# Patient Record
Sex: Male | Born: 1965 | Race: White | Hispanic: No | Marital: Single | Smoking: Never smoker
Health system: Southern US, Community
[De-identification: ages and names within clinical notes are randomized; demographics above are authoritative.]

## PROBLEM LIST (undated history)

## (undated) DIAGNOSIS — M549 Dorsalgia, unspecified: Secondary | ICD-10-CM

## (undated) DIAGNOSIS — I10 Essential (primary) hypertension: Secondary | ICD-10-CM

## (undated) HISTORY — PX: KNEE SURGERY: SHX244

## (undated) HISTORY — PX: LAMINECTOMY: SHX219

## (undated) HISTORY — PX: CARPAL TUNNEL RELEASE: SHX101

---

## 2007-01-31 ENCOUNTER — Emergency Department (HOSPITAL_COMMUNITY): Admission: EM | Admit: 2007-01-31 | Discharge: 2007-01-31 | Payer: Self-pay | Admitting: Emergency Medicine

## 2008-08-07 IMAGING — CR CERV MYELO
1 series · 1 of 1 positions shown · non-contrast
Comparison: none

[view not recorded]
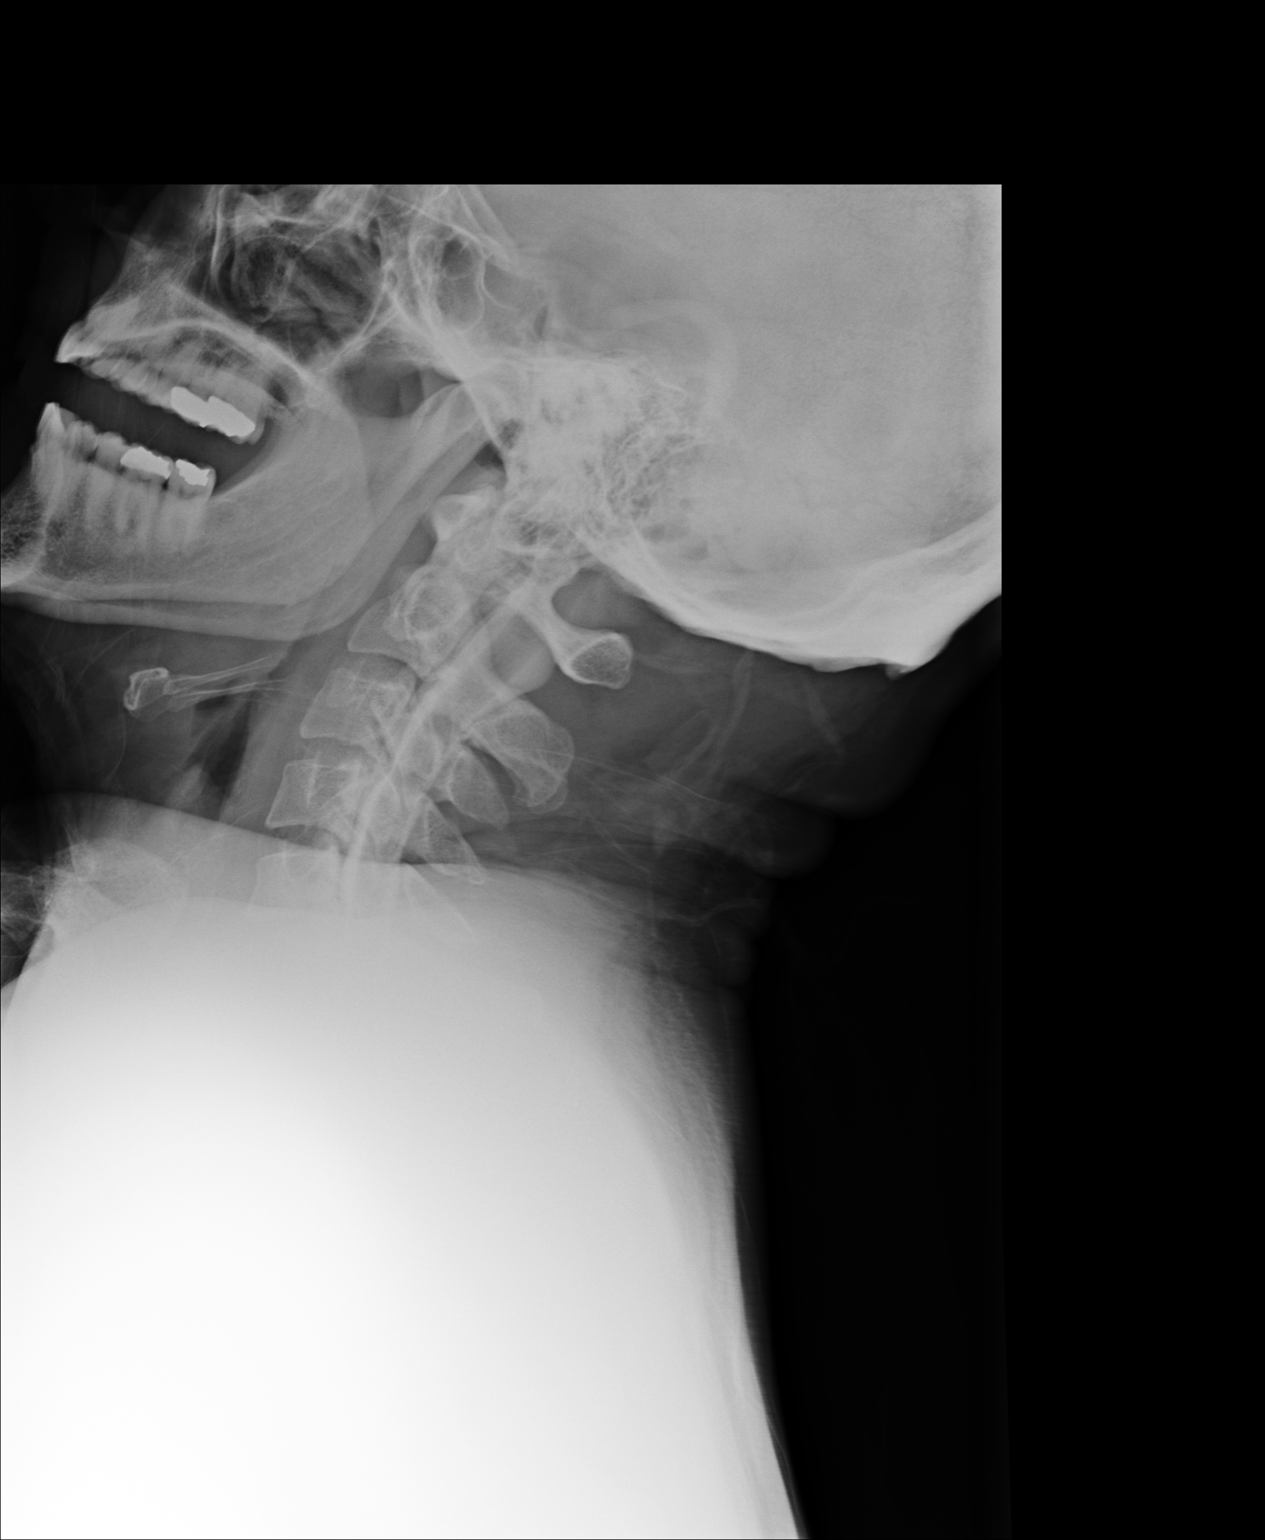

[1 of 1 positions shown; findings below may reference images not displayed]

______________________________________________________________
AW, ZIQIANG 12/03/1925 DALDUS, ALFA AMADU DJUE [DATE]

REASON FOR CONSULTATION: ROUTINE [id number]
p>____________________________________________

EXAM: SCREEN BREAST EXAM

The original x-rays and a report indicating their findings will be
stored for a period of 60 calender months as
a part of the patient's medical record.

SCREENING MAMMOGRAM

INTERPRETATION:

1. ( X ) Benign.
2. ( ) Probably benign but with areas of thickening, calcification, or
nodules
 for which follow up is suggested.
3. ( ) Indeterminant. Recommend additional studies such as ultrasound,
more views,
 follow up, aspiration, or biopsy.
4. ( ) Suspicious for carcinoma.

PARENCHYMAL PATTERN
SORIN OXENDINE [DATE]
 VERIFIED<p

______________________________________________________________

1 ( ) Fatty.
2. (  X ) Moderately dense.
3. ( ) Dense

( Dense breasts may obscure a noncalcified lesion. Sensitivity of
mammography varies inversely with density)

ADDITIONAL COMMENTS
No prior films available for comparison. The films are dated 12/15/95.
A few scattered microcalcifications are seen in both breasts. Continued
yearly follow-up is recommended.

SORIN OXENDINE [DATE]

## 2011-01-25 ENCOUNTER — Emergency Department (HOSPITAL_COMMUNITY): Payer: Self-pay

## 2011-01-25 ENCOUNTER — Emergency Department (HOSPITAL_COMMUNITY)
Admission: EM | Admit: 2011-01-25 | Discharge: 2011-01-25 | Disposition: A | Discharge status: Home / Self Care | Payer: Self-pay | Attending: Emergency Medicine | Admitting: Emergency Medicine

## 2011-01-25 DIAGNOSIS — IMO0002 Reserved for concepts with insufficient information to code with codable children: Secondary | ICD-10-CM | POA: Insufficient documentation

## 2011-01-25 DIAGNOSIS — M545 Low back pain, unspecified: Secondary | ICD-10-CM | POA: Insufficient documentation

## 2011-01-25 DIAGNOSIS — M79609 Pain in unspecified limb: Secondary | ICD-10-CM | POA: Insufficient documentation

## 2011-01-25 DIAGNOSIS — M5137 Other intervertebral disc degeneration, lumbosacral region: Secondary | ICD-10-CM | POA: Insufficient documentation

## 2011-01-25 DIAGNOSIS — M51379 Other intervertebral disc degeneration, lumbosacral region without mention of lumbar back pain or lower extremity pain: Secondary | ICD-10-CM | POA: Insufficient documentation

## 2011-01-25 LAB — URINALYSIS, ROUTINE W REFLEX MICROSCOPIC
Bilirubin Urine: NEGATIVE
Hgb urine dipstick: NEGATIVE
Ketones, ur: NEGATIVE mg/dL
Protein, ur: NEGATIVE mg/dL
Urobilinogen, UA: 0.2 mg/dL (ref 0.0–1.0)

## 2016-04-27 DIAGNOSIS — M545 Low back pain, unspecified: Secondary | ICD-10-CM | POA: Insufficient documentation

## 2016-04-27 DIAGNOSIS — G4701 Insomnia due to medical condition: Secondary | ICD-10-CM | POA: Insufficient documentation

## 2016-05-29 DIAGNOSIS — M5136 Other intervertebral disc degeneration, lumbar region: Secondary | ICD-10-CM | POA: Insufficient documentation

## 2016-05-29 DIAGNOSIS — M51369 Other intervertebral disc degeneration, lumbar region without mention of lumbar back pain or lower extremity pain: Secondary | ICD-10-CM | POA: Insufficient documentation

## 2016-07-06 DIAGNOSIS — S46219A Strain of muscle, fascia and tendon of other parts of biceps, unspecified arm, initial encounter: Secondary | ICD-10-CM | POA: Insufficient documentation

## 2016-07-23 DIAGNOSIS — M48062 Spinal stenosis, lumbar region with neurogenic claudication: Secondary | ICD-10-CM | POA: Insufficient documentation

## 2020-03-28 ENCOUNTER — Other Ambulatory Visit: Payer: Self-pay

## 2020-03-28 ENCOUNTER — Encounter (HOSPITAL_COMMUNITY): Payer: Self-pay | Admitting: Emergency Medicine

## 2020-03-28 ENCOUNTER — Emergency Department (INDEPENDENT_AMBULATORY_CARE_PROVIDER_SITE_OTHER)
Admission: EM | Admit: 2020-03-28 | Discharge: 2020-03-28 | Disposition: A | Discharge status: Home / Self Care | Payer: Self-pay | Source: Home / Self Care

## 2020-03-28 ENCOUNTER — Emergency Department (HOSPITAL_COMMUNITY)
Admission: EM | Admit: 2020-03-28 | Discharge: 2020-03-28 | Disposition: A | Discharge status: Home / Self Care | Payer: Self-pay | Attending: Emergency Medicine | Admitting: Emergency Medicine

## 2020-03-28 ENCOUNTER — Emergency Department (INDEPENDENT_AMBULATORY_CARE_PROVIDER_SITE_OTHER): Payer: Self-pay

## 2020-03-28 DIAGNOSIS — Z87828 Personal history of other (healed) physical injury and trauma: Secondary | ICD-10-CM

## 2020-03-28 DIAGNOSIS — M79644 Pain in right finger(s): Secondary | ICD-10-CM

## 2020-03-28 DIAGNOSIS — Z79899 Other long term (current) drug therapy: Secondary | ICD-10-CM | POA: Insufficient documentation

## 2020-03-28 DIAGNOSIS — Z20822 Contact with and (suspected) exposure to covid-19: Secondary | ICD-10-CM | POA: Insufficient documentation

## 2020-03-28 DIAGNOSIS — M869 Osteomyelitis, unspecified: Secondary | ICD-10-CM | POA: Insufficient documentation

## 2020-03-28 DIAGNOSIS — I10 Essential (primary) hypertension: Secondary | ICD-10-CM | POA: Insufficient documentation

## 2020-03-28 HISTORY — DX: Dorsalgia, unspecified: M54.9

## 2020-03-28 HISTORY — DX: Essential (primary) hypertension: I10

## 2020-03-28 LAB — RESPIRATORY PANEL BY RT PCR (FLU A&B, COVID)
Influenza A by PCR: NEGATIVE
Influenza B by PCR: NEGATIVE
SARS Coronavirus 2 by RT PCR: NEGATIVE

## 2020-03-28 LAB — POCT CBC W AUTO DIFF (K'VILLE URGENT CARE)

## 2020-03-28 MED ORDER — CLINDAMYCIN HCL 150 MG PO CAPS: 300.0000 mg | ORAL_CAPSULE | Freq: Four times a day (QID) | ORAL | 0 refills | Status: DC

## 2020-03-28 MED ORDER — CLINDAMYCIN HCL 150 MG PO CAPS
600.0000 mg | ORAL_CAPSULE | Freq: Once | ORAL | Status: AC
  Administered 2020-03-28: 600 mg via ORAL
  Filled 2020-03-28: qty 4

## 2020-03-28 MED ORDER — OXYCODONE-ACETAMINOPHEN 5-325 MG PO TABS: 2.0000 | ORAL_TABLET | Freq: Four times a day (QID) | ORAL | 0 refills | Status: DC | PRN

## 2020-03-28 NOTE — ED Notes (Signed)
PA assessing pt on triage.
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
## 2020-03-28 NOTE — Discharge Instructions (Addendum)
Please take antibiotics as prescribed.  I have provided you with the phone number and office information for Dr. Roney Mans of [facility].  Please follow-up first thing tomorrow in the office.  Please go to the office as soon as it opens. It is very important that you do this as you can become very very sick if you do not take the antibiotics and have the finger treated.  There is a possibility that you will likely need to have your fingertip surgically removed.  I am prescribing you 1 week of antibiotics but you may need a longer prescription depending on what your hand surgeon recommends.
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
## 2020-03-28 NOTE — Discharge Instructions (Signed)
Due to the severity of your symptoms, it is advised that you go to the emergency department tonight for further evaluation and treatment of your severe infection to help limit the risk of worsening infection. The longer the infection goes untreated, the more risk you have of losing more of your finger(s).
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
## 2020-03-28 NOTE — ED Triage Notes (Signed)
Pt send from UC for evaluation of hand surgeon of his right index for diagnose of osteomyelitis.

## 2020-03-29 DIAGNOSIS — M869 Osteomyelitis, unspecified: Secondary | ICD-10-CM | POA: Insufficient documentation

## 2020-04-01 ENCOUNTER — Other Ambulatory Visit: Payer: Self-pay

## 2020-04-01 ENCOUNTER — Ambulatory Visit (HOSPITAL_COMMUNITY): Payer: Self-pay | Admitting: Certified Registered"

## 2020-04-01 ENCOUNTER — Encounter (HOSPITAL_COMMUNITY)
Admission: RE | Disposition: A | Discharge status: Home / Self Care | Payer: Self-pay | Source: Home / Self Care | Attending: Orthopaedic Surgery

## 2020-04-01 ENCOUNTER — Encounter (HOSPITAL_COMMUNITY): Payer: Self-pay | Admitting: Orthopaedic Surgery

## 2020-04-01 ENCOUNTER — Ambulatory Visit (HOSPITAL_COMMUNITY)
Admission: RE | Admit: 2020-04-01 | Discharge: 2020-04-01 | Disposition: A | Discharge status: Home / Self Care | Payer: Self-pay | Attending: Orthopaedic Surgery | Admitting: Orthopaedic Surgery

## 2020-04-01 DIAGNOSIS — I10 Essential (primary) hypertension: Secondary | ICD-10-CM | POA: Insufficient documentation

## 2020-04-01 DIAGNOSIS — M869 Osteomyelitis, unspecified: Secondary | ICD-10-CM | POA: Insufficient documentation

## 2020-04-01 DIAGNOSIS — X58XXXA Exposure to other specified factors, initial encounter: Secondary | ICD-10-CM | POA: Insufficient documentation

## 2020-04-01 DIAGNOSIS — Z885 Allergy status to narcotic agent status: Secondary | ICD-10-CM | POA: Insufficient documentation

## 2020-04-01 DIAGNOSIS — Z79899 Other long term (current) drug therapy: Secondary | ICD-10-CM | POA: Insufficient documentation

## 2020-04-01 DIAGNOSIS — S60942A Unspecified superficial injury of right middle finger, initial encounter: Secondary | ICD-10-CM | POA: Insufficient documentation

## 2020-04-01 HISTORY — PX: AMPUTATION: SHX166

## 2020-04-01 LAB — BASIC METABOLIC PANEL
Anion gap: 10 (ref 5–15)
BUN: 9 mg/dL (ref 6–20)
CO2: 23 mmol/L (ref 22–32)
Calcium: 10.4 mg/dL — ABNORMAL HIGH (ref 8.9–10.3)
Chloride: 103 mmol/L (ref 98–111)
Creatinine, Ser: 0.82 mg/dL (ref 0.61–1.24)
GFR calc Af Amer: >60 mL/min (ref >60)
GFR calc non Af Amer: >60 mL/min (ref >60)
Glucose, Bld: 98 mg/dL (ref 70–99)
Potassium: 3.9 mmol/L (ref 3.5–5.1)
Sodium: 136 mmol/L (ref 135–145)

## 2020-04-01 LAB — SURGICAL PCR SCREEN
MRSA, PCR: NEGATIVE
Staphylococcus aureus: POSITIVE — AB

## 2020-04-01 SURGERY — AMPUTATION DIGIT
Anesthesia: Monitor Anesthesia Care | Site: Finger | Laterality: Right

## 2020-04-01 MED ORDER — PROPOFOL 500 MG/50ML IV EMUL
INTRAVENOUS | Status: DC | PRN
  Administered 2020-04-01: 100 ug/kg/min via INTRAVENOUS

## 2020-04-01 MED ORDER — ORAL CARE MOUTH RINSE: 15.0000 mL | Freq: Once | OROMUCOSAL | Status: AC

## 2020-04-01 MED ORDER — ONDANSETRON HCL 4 MG/2ML IJ SOLN
INTRAMUSCULAR | Status: DC | PRN
  Administered 2020-04-01: 4 mg via INTRAVENOUS

## 2020-04-01 MED ORDER — MIDAZOLAM HCL 2 MG/2ML IJ SOLN
INTRAMUSCULAR | Status: AC
  Filled 2020-04-01: qty 2

## 2020-04-01 MED ORDER — LIDOCAINE HCL 1 % IJ SOLN
INTRAMUSCULAR | Status: DC | PRN
  Administered 2020-04-01: 5 mL

## 2020-04-01 MED ORDER — CLINDAMYCIN HCL 150 MG PO CAPS: 300.0000 mg | ORAL_CAPSULE | Freq: Four times a day (QID) | ORAL | 0 refills | Status: AC

## 2020-04-01 MED ORDER — LACTATED RINGERS IV SOLN: INTRAVENOUS | Status: DC

## 2020-04-01 MED ORDER — BUPIVACAINE HCL (PF) 0.25 % IJ SOLN
INTRAMUSCULAR | Status: AC
  Filled 2020-04-01: qty 30

## 2020-04-01 MED ORDER — CHLORHEXIDINE GLUCONATE CLOTH 2 % EX PADS: 6.0000 | MEDICATED_PAD | Freq: Every day | CUTANEOUS | Status: DC

## 2020-04-01 MED ORDER — FENTANYL CITRATE (PF) 100 MCG/2ML IJ SOLN
INTRAMUSCULAR | Status: AC
  Administered 2020-04-01: 25 ug via INTRAVENOUS
  Filled 2020-04-01: qty 2

## 2020-04-01 MED ORDER — CHLORHEXIDINE GLUCONATE 4 % EX LIQD: 60.0000 mL | Freq: Once | CUTANEOUS | Status: DC

## 2020-04-01 MED ORDER — FENTANYL CITRATE (PF) 250 MCG/5ML IJ SOLN
INTRAMUSCULAR | Status: AC
  Filled 2020-04-01: qty 5

## 2020-04-01 MED ORDER — MUPIROCIN 2 % EX OINT: 1.0000 "application " | TOPICAL_OINTMENT | Freq: Two times a day (BID) | CUTANEOUS | Status: DC

## 2020-04-01 MED ORDER — CHLORHEXIDINE GLUCONATE 0.12 % MT SOLN: 15.0000 mL | Freq: Once | OROMUCOSAL | Status: AC

## 2020-04-01 MED ORDER — OXYCODONE-ACETAMINOPHEN 5-325 MG PO TABS: 1.0000 | ORAL_TABLET | Freq: Four times a day (QID) | ORAL | 0 refills | Status: DC | PRN

## 2020-04-01 MED ORDER — BUPIVACAINE HCL (PF) 0.25 % IJ SOLN
INTRAMUSCULAR | Status: DC | PRN
  Administered 2020-04-01: 5 mL

## 2020-04-01 MED ORDER — 0.9 % SODIUM CHLORIDE (POUR BTL) OPTIME
TOPICAL | Status: DC | PRN
  Administered 2020-04-01: 1000 mL

## 2020-04-01 MED ORDER — PROPOFOL 10 MG/ML IV BOLUS
INTRAVENOUS | Status: DC | PRN
  Administered 2020-04-01 (×2): 50 mg via INTRAVENOUS

## 2020-04-01 MED ORDER — POVIDONE-IODINE 10 % EX SWAB
2.0000 "application " | Freq: Once | CUTANEOUS | Status: AC
  Administered 2020-04-01: 2 via TOPICAL

## 2020-04-01 MED ORDER — CHLORHEXIDINE GLUCONATE 0.12 % MT SOLN
OROMUCOSAL | Status: AC
  Administered 2020-04-01: 15 mL via OROMUCOSAL
  Filled 2020-04-01: qty 15

## 2020-04-01 MED ORDER — PROPOFOL 10 MG/ML IV BOLUS
INTRAVENOUS | Status: AC
  Filled 2020-04-01: qty 20

## 2020-04-01 MED ORDER — FENTANYL CITRATE (PF) 100 MCG/2ML IJ SOLN: 50.0000 ug | Freq: Once | INTRAMUSCULAR | Status: AC

## 2020-04-01 MED ORDER — LIDOCAINE HCL (PF) 1 % IJ SOLN
INTRAMUSCULAR | Status: AC
  Filled 2020-04-01: qty 30

## 2020-04-01 MED ORDER — MIDAZOLAM HCL 5 MG/5ML IJ SOLN
INTRAMUSCULAR | Status: DC | PRN
  Administered 2020-04-01: 2 mg via INTRAVENOUS

## 2020-04-01 MED ORDER — CEFAZOLIN SODIUM-DEXTROSE 2-3 GM-%(50ML) IV SOLR
INTRAVENOUS | Status: DC | PRN
  Administered 2020-04-01: 2 g via INTRAVENOUS

## 2020-04-01 SURGICAL SUPPLY — 50 items
BNDG COHESIVE 2X5 TAN STRL LF (GAUZE/BANDAGES/DRESSINGS) ×3 IMPLANT
BNDG CONFORM 2 STRL LF (GAUZE/BANDAGES/DRESSINGS) ×3 IMPLANT
BNDG CONFORM 3 STRL LF (GAUZE/BANDAGES/DRESSINGS) ×3 IMPLANT
BNDG ELASTIC 3X5.8 VLCR STR LF (GAUZE/BANDAGES/DRESSINGS) ×3 IMPLANT
BNDG ELASTIC 4X5.8 VLCR STR LF (GAUZE/BANDAGES/DRESSINGS) ×3 IMPLANT
BNDG ESMARK 4X9 LF (GAUZE/BANDAGES/DRESSINGS) IMPLANT
BNDG GAUZE ELAST 4 BULKY (GAUZE/BANDAGES/DRESSINGS) ×3 IMPLANT
CHLORAPREP W/TINT 26 (MISCELLANEOUS) IMPLANT
CONT SPEC 4OZ CLIKSEAL STRL BL (MISCELLANEOUS) ×9 IMPLANT
CORD BIPOLAR FORCEPS 12FT (ELECTRODE) ×3 IMPLANT
COVER SURGICAL LIGHT HANDLE (MISCELLANEOUS) ×3 IMPLANT
COVER WAND RF STERILE (DRAPES) IMPLANT
CUFF TOURN SGL QUICK 18X4 (TOURNIQUET CUFF) ×3 IMPLANT
CUFF TOURN SGL QUICK 24 (TOURNIQUET CUFF)
CUFF TRNQT CYL 24X4X16.5-23 (TOURNIQUET CUFF) IMPLANT
DRAIN PENROSE 18X1/4 LTX STRL (DRAIN) ×3 IMPLANT
DRAPE U-SHAPE 47X51 STRL (DRAPES) ×3 IMPLANT
GAUZE SPONGE 4X4 12PLY STRL (GAUZE/BANDAGES/DRESSINGS) ×3 IMPLANT
GAUZE SPONGE 4X4 12PLY STRL LF (GAUZE/BANDAGES/DRESSINGS) ×3 IMPLANT
GAUZE XEROFORM 1X8 LF (GAUZE/BANDAGES/DRESSINGS) ×3 IMPLANT
GAUZE XEROFORM 5X9 LF (GAUZE/BANDAGES/DRESSINGS) ×3 IMPLANT
GLOVE BIOGEL PI IND STRL 8 (GLOVE) ×1 IMPLANT
GLOVE BIOGEL PI INDICATOR 8 (GLOVE) ×2
GLOVE SURG SYN 7.5  E (GLOVE) ×3
GLOVE SURG SYN 7.5 E (GLOVE) ×1 IMPLANT
GOWN STRL REUS W/ TWL LRG LVL3 (GOWN DISPOSABLE) ×1 IMPLANT
GOWN STRL REUS W/TWL LRG LVL3 (GOWN DISPOSABLE) ×3
KIT BASIN OR (CUSTOM PROCEDURE TRAY) ×3 IMPLANT
KIT TURNOVER KIT B (KITS) ×3 IMPLANT
MANIFOLD NEPTUNE II (INSTRUMENTS) IMPLANT
NEEDLE HYPO 25GX1X1/2 BEV (NEEDLE) IMPLANT
NS IRRIG 1000ML POUR BTL (IV SOLUTION) ×3 IMPLANT
PACK ORTHO EXTREMITY (CUSTOM PROCEDURE TRAY) ×3 IMPLANT
PAD ARMBOARD 7.5X6 YLW CONV (MISCELLANEOUS) ×3 IMPLANT
PAD CAST 4YDX4 CTTN HI CHSV (CAST SUPPLIES) IMPLANT
PADDING CAST COTTON 4X4 STRL (CAST SUPPLIES)
SET CYSTO W/LG BORE CLAMP LF (SET/KITS/TRAYS/PACK) IMPLANT
SPONGE LAP 4X18 RFD (DISPOSABLE) IMPLANT
SUT PROLENE 4 0 PS 2 18 (SUTURE) ×3 IMPLANT
SWAB CULTURE ESWAB REG 1ML (MISCELLANEOUS) IMPLANT
SYR BULB IRRIG 60ML STRL (SYRINGE) ×3 IMPLANT
SYR CONTROL 10ML LL (SYRINGE) IMPLANT
TOWEL GREEN STERILE (TOWEL DISPOSABLE) ×3 IMPLANT
TOWEL GREEN STERILE FF (TOWEL DISPOSABLE) ×3 IMPLANT
TUBE CONNECTING 12'X1/4 (SUCTIONS) ×1
TUBE CONNECTING 12X1/4 (SUCTIONS) ×2 IMPLANT
TUBING TUR DISP (UROLOGICAL SUPPLIES) IMPLANT
UNDERPAD 30X36 HEAVY ABSORB (UNDERPADS AND DIAPERS) ×3 IMPLANT
WATER STERILE IRR 1000ML POUR (IV SOLUTION) ×3 IMPLANT
YANKAUER SUCT BULB TIP NO VENT (SUCTIONS) ×3 IMPLANT

## 2020-04-01 NOTE — Anesthesia Preprocedure Evaluation (Signed)
Anesthesia Evaluation  Patient identified by MRN, date of birth, ID band Patient awake    Reviewed: Allergy & Precautions, NPO status , Patient's Chart, lab work & pertinent test results  Airway Mallampati: II  TM Distance: >3 FB Neck ROM: Full    Dental  (+) Teeth Intact, Dental Advisory Given, Poor Dentition   Pulmonary neg pulmonary ROS,    breath sounds clear to auscultation       Cardiovascular hypertension, Pt. on home beta blockers and Pt. on medications  Rhythm:Regular Rate:Normal     Neuro/Psych negative neurological ROS  negative psych ROS   GI/Hepatic negative GI ROS, Neg liver ROS,   Endo/Other  negative endocrine ROS  Renal/GU negative Renal ROS     Musculoskeletal negative musculoskeletal ROS (+)   Abdominal Normal abdominal exam  (+)   Peds  Hematology negative hematology ROS (+)   Anesthesia Other Findings   Reproductive/Obstetrics                             Anesthesia Physical Anesthesia Plan  ASA: II  Anesthesia Plan: MAC   Post-op Pain Management:    Induction: Intravenous  PONV Risk Score and Plan: 2 and Ondansetron and Propofol infusion  Airway Management Planned: Natural Airway and Simple Face Mask  Additional Equipment: None  Intra-op Plan:   Post-operative Plan:   Informed Consent: I have reviewed the patients History and Physical, chart, labs and discussed the procedure including the risks, benefits and alternatives for the proposed anesthesia with the patient or authorized representative who has indicated his/her understanding and acceptance.     Dental advisory given  Plan Discussed with: CRNA  Anesthesia Plan Comments:         Anesthesia Quick Evaluation

## 2020-04-01 NOTE — Op Note (Signed)
PREOPERATIVE DIAGNOSIS: Right index finger distal phalanx osteomyelitis.  Right middle finger superficial tip wound  POSTOPERATIVE DIAGNOSIS: Same  ATTENDING PHYSICIAN: Maudry Mayhew. Jeannie Fend, III, MD who was present and scrubbed for the entire case   ASSISTANT SURGEON: None.   ANESTHESIA: Local with MAC  SURGICAL PROCEDURES: Partial amputation of the right index finger through the middle phalanx  SURGICAL INDICATIONS: Patient is a 54 year old male who was seen and evaluated by me in clinic.  He had numbness and tingling to his hands which followed to a carpal tunnel release at outside facility.  He developed a nonhealing wound to the tip of the index finger.  This became rather painful and radiographs were obtained at urgent care.  This showed destructive lesion to the distal phalanx consistent with osteomyelitis.  Additionally he had some superficial wound to the radial aspect of the middle finger.  There was no destruction on his radiographs of that digit.  He presents today for a right index finger partial amputation.  FINDING: See op note  DESCRIPTION OF PROCEDURE: Patient was identified in the preoperative holding area where the risk benefits and alternatives of the procedure were discussed with the patient.  These risks include but are not limited to infection, bleeding, damage to surrounding structures including blood vessels and nerves, pain, stiffness, wound healing complications, neuroma formation and need for additional procedures.  Informed consent was obtained at that time the patient's right hand was marked with a surgical marking pen.  He was then brought back to the operative suite where timeout was performed identifying the correct patient operative site.  He was positioned supine on the operative table with his hand outstretched on a hand table.  He was induced under MAC sedation.  A combination of 1% lidocaine and quarter percent bupivacaine were then injected the palm at the base of  the index finger.  This was around both radial and ulnar neurovascular bundles.  A secondary injection site was then placed across the dorsal aspect of the index finger MP joint.  This provided full anesthesia to the operative area through the procedure.  At this point the right upper extremity was then prepped using a Betadine paint and scrub.  Drapes were placed.  A Penrose drain was then placed around the base of the index finger to act as a digital tourniquet.  There was a chronic wound at the tip of the index finger.  No purulence could be expressed.  A fishmouth incision was then made proximal to this focused around the DIP joint to the index finger.  Palmar and dorsal skin flaps were elevated.  15 blade was used to incise through the DIP joint and the distal phalanx in its entirety was removed.  This did appear to be proximal to the primary zone of infection as there is no purulence within this wound.  The wound itself was cultured for both aerobic and anaerobic bacteria.  The distal finger tip was sent to pathology for analysis as well as a portion of it for culture.  The radial and ulnar neurovascular bundles were identified and cauterized.  The middle phalanx distally was then skeletonized and a bone biter was used to remove the articular condyle of the middle phalanx.  This bone was also sent to pathology to confirm it was outside of the zone of infection.  The wound at this point was then copiously irrigated with normal saline.  The palmar and dorsal skin flaps were then closed over top of the middle  phalanx with multiple interrupted 4-0 Prolene sutures.  Xeroform was then placed over the amputated finger tip as well as the wound to the middle phalanx.  4 x 4's, Kling wrap and Coban were then placed in the digits.  The tourniquet was released prior to dressing application.  The patient was then awoken from his anesthesia and taken the PACU in stable condition.  He tolerated the procedure well and there  were no complications.  ESTIMATED BLOOD LOSS: 10 mL  TOURNIQUET TIME: Less than 30 minutes  SPECIMENS: Aerobic and anaerobic cultures x1, distal phalanx culture x1, distal phalanx pathology x1, middle phalanx pathology x1  POSTOPERATIVE PLAN: Patient will be discharged home on oral antibiotics.  I will plan to see him back in clinic in 5 to 7 days for wound check.  He should leave his dressings in place.  We will work on getting him established as an outpatient for some antibiotic recommendations from infectious disease.  We will continue to monitor his middle finger for wound healing.  IMPLANTS: None

## 2020-04-01 NOTE — Discharge Instructions (Signed)
Discharge Instructions  - Keep dressings in place. Do not remove them. - The dressings must stay dry - Take all medication as prescribed. Transition to over the counter pain medication as your pain improves - Keep the hand elevated over the next 48-72 hours to help with pain and swelling - Move all digits not restricted by the dressings regularly to prevent stiffness - Please call to schedule a follow up appointment with Dr. Kynslie Ringle and therapy at [phone] for 5/7 days following surgery - Your pain medication have been sent digitally to your pharmacy  

## 2020-04-01 NOTE — H&P (Signed)
ORTHOPAEDIC H&P  PCP:  Pcp, No  Chief Complaint: Right index finger distal phalanx osteomyelitis  HPI: Jacob Sanchez is a 54 y.o. male who complains of right index finger pain.  He was seen at [facility] urgent care where he was found to have destructive lesion of his right index finger distal phalanx.  This was consistent with osteomyelitis as he had a wound which was chronic in nature 6 August to the tip of the digit.  Based on the cortical and extensive destruction of the distal phalanx, did recommend proceeding forward with partial amputation of the digit and antibiotic therapy.  He presents today for that.  Past Medical History:  Diagnosis Date  . Back pain    hx laminectomy  . Hypertension    Past Surgical History:  Procedure Laterality Date  . CARPAL TUNNEL RELEASE Right   . KNEE SURGERY Right   . LAMINECTOMY     Social History   Socioeconomic History  . Marital status: Married    Spouse name: Not on file  . Number of children: Not on file  . Years of education: Not on file  . Highest education level: Not on file  Occupational History  . Not on file  Tobacco Use  . Smoking status: Never Smoker  . Smokeless tobacco: Current User  Vaping Use  . Vaping Use: Never used  Substance and Sexual Activity  . Alcohol use: Yes    Comment: socially  . Drug use: Never  . Sexual activity: Not on file  Other Topics Concern  . Not on file  Social History Narrative  . Not on file   Social Determinants of Health   Financial Resource Strain:   . Difficulty of Paying Living Expenses: Not on file  Food Insecurity:   . Worried About Programme researcher, broadcasting/film/video in the Last Year: Not on file  . Ran Out of Food in the Last Year: Not on file  Transportation Needs:   . Lack of Transportation (Medical): Not on file  . Lack of Transportation (Non-Medical): Not on file  Physical Activity:   . Days of Exercise per Week: Not on file  . Minutes of Exercise per Session: Not on  file  Stress:   . Feeling of Stress : Not on file  Social Connections:   . Frequency of Communication with Friends and Family: Not on file  . Frequency of Social Gatherings with Friends and Family: Not on file  . Attends Religious Services: Not on file  . Active Member of Clubs or Organizations: Not on file  . Attends Banker Meetings: Not on file  . Marital Status: Not on file   History reviewed. No pertinent family history. Allergies  Allergen Reactions  . Bee Venom Anaphylaxis  . Demerol [Meperidine] Anaphylaxis  . Codeine Rash   Prior to Admission medications   Medication Sig Start Date End Date Taking? Authorizing Provider  atenolol (TENORMIN) 25 MG tablet Take 25 mg by mouth daily.    Yes [provider]  clindamycin (CLEOCIN) 150 MG capsule Take 2 capsules (300 mg total) by mouth 4 (four) times daily for 7 days. 03/28/20 04/04/20 Yes Fondaw, Wylder S, PA  lisinopril (ZESTRIL) 20 MG tablet Take 20 mg by mouth daily.    Yes [provider]  oxyCODONE-acetaminophen (PERCOCET/ROXICET) 5-325 MG tablet Take 2 tablets by mouth every 6 (six) hours as needed for severe pain. 03/28/20  Yes Gailen Shelter, PA  triamterene-hydrochlorothiazide (MAXZIDE-25) 37.5-25  MG tablet Take 1 tablet by mouth daily.   Yes [provider]   No results found.  Positive ROS: All other systems have been reviewed and were otherwise negative with the exception of those mentioned in the HPI and as above.  Physical Exam: General: Alert, no acute distress Cardiovascular: No edema Respiratory: No cyanosis, no use of accessory musculature Psychiatric: Patient is competent for consent with normal mood and affect  MUSCULOSKELETAL:  Examination of the right hand shows a wound to the tip of the index finger. There is swelling and erythema to the index finger but minimal to no tenderness to palpation. He has intact flexion and extension to the index PIP joint. No active  flexion or extension to the DIP joint of the digit. No purulence can be expressed from the wound. There is significant mobility of the pulp skin to the index finger though. The middle finger also has superficial skin loss along the radial aspect of the pulp. He again has no tenderness to palpation. There is no erythema around this area.  Assessment: #1 right index finger distal phalanx osteomyelitis 2.  Right middle finger superficial wound  Plan: Plan proceed forward with partial amputation of the right index finger.  Distal likely be transphalangeal to the middle phalanx.  We will continue to monitor his right middle finger as I do not think there is an indication for surgical intervention at this time.  The risks of surgery were once again discussed with the patient.  These risks include but are not limited to infection, bleeding, damage to surrounding structures including blood vessels and nerves, pain, stiffness, wound healing complications, neuroma formation and need for additional procedures.  Informed consent was obtained at that time.  Likely plan for discharge home postoperatively pending intraoperative findings.    Ernest Mallick, MD [phone]   04/01/2020 3:15 PM

## 2020-04-01 NOTE — Transfer of Care (Signed)
Immediate Anesthesia Transfer of Care Note  Patient: Jacob Sanchez  Procedure(s) Performed: Right  index finger partial amputation (Right Finger)  Patient Location: PACU  Anesthesia Type:MAC  Level of Consciousness: awake, alert  and oriented  Airway & Oxygen Therapy: Patient Spontanous Breathing and Patient connected to face mask oxygen  Post-op Assessment: Report given to RN and Post -op Vital signs reviewed and stable  Post vital signs: Reviewed and stable  Last Vitals:  Vitals Value Taken Time  BP 120/100 04/01/20 1625  Temp    Pulse 72 04/01/20 1628  Resp 20 04/01/20 1628  SpO2 99 % 04/01/20 1628  Vitals shown include unvalidated device data.  Last Pain:  Vitals:   04/01/20 1205  TempSrc:   PainSc: 8          Complications: No complications documented.

## 2020-04-02 ENCOUNTER — Encounter (HOSPITAL_COMMUNITY): Payer: Self-pay | Admitting: Orthopaedic Surgery

## 2020-04-02 NOTE — Anesthesia Postprocedure Evaluation (Signed)
Anesthesia Post Note  Patient: VELTON ROSELLE  Procedure(s) Performed: Right  index finger partial amputation (Right Finger)     Patient location during evaluation: PACU Anesthesia Type: MAC Level of consciousness: awake and alert Pain management: pain level controlled Vital Signs Assessment: post-procedure vital signs reviewed and stable Respiratory status: spontaneous breathing, nonlabored ventilation, respiratory function stable and patient connected to nasal cannula oxygen Cardiovascular status: stable and blood pressure returned to baseline Postop Assessment: no apparent nausea or vomiting Anesthetic complications: no   No complications documented.  Last Vitals:  Vitals:   04/01/20 1700 04/01/20 1735  BP: (!) 149/102 (!) 140/100  Pulse: 70 71  Resp: 16 18  Temp:  36.4 C  SpO2: 99% 99%    Last Pain:  Vitals:   04/01/20 1700  TempSrc:   PainSc: 0-No pain                 Effie Berkshire

## 2020-04-03 LAB — ACID FAST SMEAR (AFB, MYCOBACTERIA): Acid Fast Smear: NEGATIVE

## 2020-04-04 LAB — SURGICAL PATHOLOGY

## 2020-04-06 LAB — AEROBIC/ANAEROBIC CULTURE W GRAM STAIN (SURGICAL/DEEP WOUND)
Culture: NO GROWTH
Gram Stain: NONE SEEN

## 2020-04-09 ENCOUNTER — Encounter: Payer: Self-pay | Admitting: Infectious Diseases

## 2020-04-09 ENCOUNTER — Other Ambulatory Visit: Payer: Self-pay

## 2020-04-09 ENCOUNTER — Ambulatory Visit (INDEPENDENT_AMBULATORY_CARE_PROVIDER_SITE_OTHER): Payer: Self-pay | Admitting: Infectious Diseases

## 2020-04-09 ENCOUNTER — Other Ambulatory Visit: Payer: Self-pay | Admitting: Infectious Diseases

## 2020-04-09 VITALS — BP 166/97 | HR 80 | Wt 218.0 lb

## 2020-04-09 DIAGNOSIS — M869 Osteomyelitis, unspecified: Secondary | ICD-10-CM

## 2020-04-09 DIAGNOSIS — Z5181 Encounter for therapeutic drug level monitoring: Secondary | ICD-10-CM

## 2020-04-09 MED ORDER — SULFAMETHOXAZOLE-TRIMETHOPRIM 400-80 MG PO TABS: 1.0000 | ORAL_TABLET | Freq: Three times a day (TID) | ORAL | 0 refills | Status: DC

## 2020-04-09 NOTE — Progress Notes (Signed)
[facility]                                                             [address], [place], [place], [address]                                                                  Phn. [phone]; Fax: [phone]                                                                             Date: 04/09/20  Reason for Referral: Osteomyelitis   Assessment 54 Year Old Caucsian Male with a PMH of HTN, chronic back pain and Carpal Tunnel Release in 2019 ( causing decreased sensation in his Rt hand) with chronic non traumatic wound at the Rt index finger since August. 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 S/p Partial amputation of the right index finger through the middle phalanx on 10/4 and entire distal phalanx was removed. OR cx growing MSSA. Patient on Clindamycin prior to OR. He also has a superficial wound on the RT middle finger associated with a thermal burn. Path from distal phalanx is s/o OM and path from middle phalanx with benign bone and connective tissue  Plan Will increase dosing of Bactrim from 1 tab PO BID to 1 tab PO TID given his weight  Will get in touch with Dr Roney Mans regarding OR findings if any concern of Osteo in the remaining bones Will plan to continue Bactrim 2-4 weeks or more depending upon his clinical progression and discussion with Dr Roney Mans Follow up in 2-3  weeks  BMP today. I have discussed with patient regarding side effects of Bactrim including hyperkalemia.   I spent greater than 60 minutes with the patient including greater than 50% of time in face to face counsel of the patient and in coordination of their care.   Odette Fraction, MD [facility]  Office phone [phone] Fax no.  [phone] ______________________________________________________________________________________________________________________  HPI: 54 Year Old Caucsian Male with a PMH of HTN and chronic back pain who is referred to [facility] office for evaluation of RT index finger and Rt  Middle finger wound. The wound in the Rt index finger started as a swelling when he woke up in the morning in May. He does not recall any inciting incidents like trauma, insect bite, animal bite, unusual water exposure to that area. Has a hx of carpal tunnel surgery on his Right wrist in 2019, which has reduced sensation in his hand. He works in [employer]. Has one dog at home who is healthy. Denies any dig bite/licking. The wound has remained open since then and became painful afterwards. The wound continued to be deeper and his  entire left index finger was swollen prompting him to go to the ED on 9/30. He also had chills and high temperature as well.   He also had a thermal burn on the dorsal aspect of his Right middle finger in August 2021 that healed but then turned into a new open wound on the volar side. Xray at the ED suggestive of Osteomyelitis of the second distal phalanx of rt hand and soft tissue swelling. He was discharged from ED with PO clindamycin PO. Patient was seen by Dr Roney Mans and underwent Partial amputation of the right index finger through the middle phalanx on 10/4 and entire distal phalanx was removed. OR notes from Dr Roney Mans reviewed in detail. OR cx distal phalaxn grew MSSA. Path from distal phalanx with findings consistent with osteo while path from middle phalanx with benign bone and fibroconnective tissue. He was switched from Clindamycin to Bactrim since last Friday as Staph aureus was R to clindamycin.  Patient says the dressing has not been opened by Dr Roney Mans yet and hence, I will not open his dressing today. He has been having some GI upset/nausea with bactrim along with few episodes of  loose, semisolid stool. I told him to take bactrim with food. Denies any new rashes, itching or other side effects.   ROS- 10 point ROS negative except as stated above  Past Medical History:  Diagnosis Date  . Back pain    hx laminectomy  . Hypertension    Past Surgical History:  Procedure Laterality Date  . AMPUTATION Right 04/01/2020   Procedure: Right  index finger partial amputation;  Surgeon: Ernest Mallick, MD;  Location: [facility] OR;  Service: Orthopedics;  Laterality: Right;  with block  . CARPAL TUNNEL RELEASE Right   . KNEE SURGERY Right   . LAMINECTOMY      Current Outpatient Medications on File Prior to Visit  Medication Sig Dispense Refill  . atenolol (TENORMIN) 25 MG tablet Take 25 mg by mouth daily.     Marland Kitchen lisinopril (ZESTRIL) 20 MG tablet Take 20 mg by mouth daily.     Marland Kitchen triamterene-hydrochlorothiazide (MAXZIDE-25) 37.5-25 MG tablet Take 1 tablet by mouth daily.    Marland Kitchen oxyCODONE-acetaminophen (PERCOCET/ROXICET) 5-325 MG tablet Take 1-2 tablets by mouth every 6 (six) hours as needed for severe pain. (Patient not taking: Reported on 04/09/2020) 25 tablet 0   No current facility-administered medications on file prior to visit.   Allergies  Allergen Reactions  . Bee Venom Anaphylaxis  . Demerol [Meperidine] Anaphylaxis  . Codeine Rash   Social History   Socioeconomic History  . Marital status: Married    Spouse name: Not on file  . Number of children: Not on file  . Years of education: Not on file  . Highest education level: Not on file  Occupational History  . Not on file  Tobacco Use  . Smoking status: Never Smoker  . Smokeless tobacco: Current User  Vaping Use  . Vaping Use: Never used  Substance and Sexual Activity  . Alcohol use: Yes    Comment: socially  . Drug use: Never  . Sexual activity: Not on file  Other Topics Concern  . Not on file  Social History Narrative  . Not on file   Social Determinants of Health   Financial Resource Strain:    . Difficulty of Paying Living Expenses: Not on file  Food Insecurity:   . Worried About Programme researcher, broadcasting/film/video in the Last Year: Not on  file  . Ran Out of Food in the Last Year: Not on file  Transportation Needs:   . Lack of Transportation (Medical): Not on file  . Lack of Transportation (Non-Medical): Not on file  Physical Activity:   . Days of Exercise per Week: Not on file  . Minutes of Exercise per Session: Not on file  Stress:   . Feeling of Stress : Not on file  Social Connections:   . Frequency of Communication with Friends and Family: Not on file  . Frequency of Social Gatherings with Friends and Family: Not on file  . Attends Religious Services: Not on file  . Active Member of Clubs or Organizations: Not on file  . Attends Banker Meetings: Not on file  . Marital Status: Not on file  Intimate Partner Violence:   . Fear of Current or Ex-Partner: Not on file  . Emotionally Abused: Not on file  . Physically Abused: Not on file  . Sexually Abused: Not on file     Vitals BP (!) 166/97   Pulse 80   Wt 218 lb (98.9 kg)   SpO2 99%   BMI 27.25 kg/m   Examination  General - not in acute distress, comfortably sitting in chair HEENT - PEERLA, no pallor and no icterus Chest - b/l clear air entry, no additional sounds CVS- Normal s1s2, RRR Abdomen - Soft, Non tender , non distended Ext- no pedal edema, RT HAND IS BANDAGED  Neuro: grossly normal Back - WNL Psych : calm and cooperative   Recent labs   Pertinent Microbiology Results for orders placed or performed during the hospital encounter of 04/01/20  Surgical pcr screen     Status: Abnormal   Collection Time: 04/01/20 12:35 PM   Specimen: Nasal Mucosa; Nasal Swab  Result Value Ref Range Status   MRSA, PCR NEGATIVE NEGATIVE Final   Staphylococcus aureus POSITIVE (A) NEGATIVE Final    Comment: (NOTE) The Xpert SA Assay (FDA approved for NASAL specimens in patients 37 years of age and older), is one  component of a comprehensive surveillance program. It is not intended to diagnose infection nor to guide or monitor treatment. Performed at [facility], [address], [place], [place] [address]   Fungus Culture With Stain     Status: None (Preliminary result)   Collection Time: 04/01/20  3:51 PM   Specimen: Soft Tissue, Other  Result Value Ref Range Status   Fungus Stain Final report  Final    Comment: (NOTE) Performed At: [facility] [address] [place], [place] [id number] Jolene Schimke MD [id number]    Fungus (Mycology) Culture PENDING  Incomplete   Fungal Source WOUND  Final    Comment: RIGHT FINGER Performed at [facility], [address], [place], [place] [address]   Aerobic/Anaerobic Culture (surgical/deep wound)     Status: None   Collection Time: 04/01/20  3:51 PM   Specimen: Soft Tissue, Other  Result Value Ref Range Status   Specimen Description WOUND RIGHT FINGER  Final   Special Requests PT ON ANCEF  Final   Gram Stain   Final    RARE WBC PRESENT, PREDOMINANTLY MONONUCLEAR NO ORGANISMS SEEN    Culture   Final    No growth aerobically or anaerobically. Performed at [facility], [address], [place], [place] [address]    Report Status 04/06/2020 FINAL  Final  Acid Fast Smear (AFB)     Status: None  Collection Time: 04/01/20  3:51 PM   Specimen: Soft Tissue, Other  Result Value Ref Range Status   AFB Specimen Processing Concentration  Final   Acid Fast Smear Negative  Final    Comment: (NOTE) Performed At: [facility] [address] [place], [place] [id number] Jolene SchimkeNagendra Sanjai MD WU:[id number]:[phone]    Source (AFB) WOUND  Final    Comment: RIGHT FINGER Performed at [facility], [address] [place], [place] [address]   Fungus Culture Result     Status: None   Collection Time: 04/01/20  3:51 PM  Result Value Ref Range Status   Result 1 Comment  Final    Comment: (NOTE) KOH/Calcofluor preparation:   no fungus observed. Performed At: [facility] [address] [place], [place] [id number] Jolene SchimkeNagendra Sanjai MD [id number]:[phone]   Aerobic/Anaerobic Culture (surgical/deep wound)     Status: None   Collection Time: 04/01/20  4:15 PM   Specimen: Soft Tissue, Other  Result Value Ref Range Status   Specimen Description TISSUE  Final   Special Requests DISTAL PHALANX PT ON ANCEF  Final   Gram Stain NO WBC SEEN NO ORGANISMS SEEN   Final   Culture   Final    RARE STAPHYLOCOCCUS AUREUS NO ANAEROBES ISOLATED Performed at [facility], [address] [place], [place] [address]    Report Status 04/06/2020 FINAL  Final   Organism ID, Bacteria STAPHYLOCOCCUS AUREUS  Final      Susceptibility   Staphylococcus aureus - MIC*    CIPROFLOXACIN <=0.5 SENSITIVE Sensitive     ERYTHROMYCIN >=8 RESISTANT Resistant     GENTAMICIN <=0.5 SENSITIVE Sensitive     OXACILLIN <=0.25 SENSITIVE Sensitive     TETRACYCLINE <=1 SENSITIVE Sensitive     VANCOMYCIN <=0.5 SENSITIVE Sensitive     TRIMETH/SULFA <=10 SENSITIVE Sensitive     CLINDAMYCIN RESISTANT Resistant     RIFAMPIN <=0.5 SENSITIVE Sensitive     Inducible Clindamycin POSITIVE Resistant     * RARE STAPHYLOCOCCUS AUREUS    Pertinent Imaging Xray RT Hand 03/28/20   FINDINGS: Osteolytic changes involving the tuft and distal shaft of the second distal phalanx with bony fragmentation. No fracture or malalignment. Positive for soft tissue swelling.  IMPRESSION: Osteolytic changes with fragmentation of the tuft and distal shaft of the second distal phalanx concerning for osteomyelitis  All pertinent labs/Imagings/notes reviewed. All pertinent plain films and CT images have been personally visualized and interpreted; radiology reports have been reviewed. Decision making incorporated into the Impression / Recommendations.

## 2020-04-10 LAB — BASIC METABOLIC PANEL
BUN: 11 mg/dL (ref 7–25)
CO2: 26 mmol/L (ref 20–32)
Calcium: 10.8 mg/dL — ABNORMAL HIGH (ref 8.6–10.3)
Chloride: 104 mmol/L (ref 98–110)
Creat: 0.92 mg/dL (ref 0.70–1.33)
Glucose, Bld: 108 mg/dL — ABNORMAL HIGH (ref 65–99)
Potassium: 4.5 mmol/L (ref 3.5–5.3)
Sodium: 136 mmol/L (ref 135–146)

## 2020-04-25 ENCOUNTER — Other Ambulatory Visit: Payer: Self-pay

## 2020-04-30 LAB — FUNGAL ORGANISM REFLEX

## 2020-04-30 LAB — FUNGUS CULTURE WITH STAIN

## 2020-04-30 LAB — FUNGUS CULTURE RESULT

## 2020-05-16 LAB — ACID FAST CULTURE WITH REFLEXED SENSITIVITIES (MYCOBACTERIA): Acid Fast Culture: NEGATIVE

## 2020-05-22 ENCOUNTER — Telehealth: Payer: Self-pay

## 2020-05-22 ENCOUNTER — Other Ambulatory Visit: Payer: Self-pay

## 2020-05-22 ENCOUNTER — Ambulatory Visit (INDEPENDENT_AMBULATORY_CARE_PROVIDER_SITE_OTHER): Payer: Self-pay | Admitting: Infectious Diseases

## 2020-05-22 ENCOUNTER — Encounter: Payer: Self-pay | Admitting: Infectious Diseases

## 2020-05-22 VITALS — BP 169/99 | HR 91 | Temp 98.0°F | Ht 75.0 in | Wt 229.0 lb

## 2020-05-22 DIAGNOSIS — T148XXA Other injury of unspecified body region, initial encounter: Secondary | ICD-10-CM

## 2020-05-22 DIAGNOSIS — Z5181 Encounter for therapeutic drug level monitoring: Secondary | ICD-10-CM | POA: Insufficient documentation

## 2020-05-22 DIAGNOSIS — M869 Osteomyelitis, unspecified: Secondary | ICD-10-CM

## 2020-05-22 DIAGNOSIS — Z23 Encounter for immunization: Secondary | ICD-10-CM | POA: Insufficient documentation

## 2020-05-22 DIAGNOSIS — M868X4 Other osteomyelitis, hand: Secondary | ICD-10-CM

## 2020-05-22 DIAGNOSIS — S61202D Unspecified open wound of right middle finger without damage to nail, subsequent encounter: Secondary | ICD-10-CM

## 2020-05-22 NOTE — Assessment & Plan Note (Signed)
Declined flu and covid vax

## 2020-05-22 NOTE — Assessment & Plan Note (Signed)
CBC and BMP today. 

## 2020-05-22 NOTE — Progress Notes (Signed)
[facility]                                                             [address], [place], [place], [address]                                                                  Phn. [phone]; Fax: [phone]                                                                             Date: 05/22/20  Reason for Follow Up: Rt forefinger wound and rt middle finger wound   Assessment 54 Year Old Caucsian Male with a PMH of HTN, chronic back pain and Carpal Tunnel Release in 2019 ( causing decreased sensation in his Rt hand) with chronic non traumatic wound at the Rt index finger since August. 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 S/p Partial amputation of the right index finger through the middle phalanx on 10/4 and entire distal phalanx was removed. OR cx growing MSSA. Patient on Clindamycin prior to OR. He also has a superficial wound on the RT middle finger associated with a thermal burn. Path from distal phalanx is s/o OM and path from middle phalanx with benign bone and connective tissue  Plan Patient has already completed approx 6  weeks of Bactrim with good healing of the site of amputation at the RT index finger. The wound in the Rt middle finger also appears to be healing and appears superficial. He says he had an Xray done by Dr Roney Mans last week that did not show bone infection. Will request records.  CBC and BMP Today FU PRN   I spent greater than 60 minutes with the patient including greater than 50% of time in face to face counsel of the patient and in coordination of their care.   Odette Fraction, MD [facility]  Office phone [phone] Fax no. [phone] ______________________________________________________________________________________________________________________  HPI: 54 Year Old Caucsian Male with a PMH of  HTN and chronic back pain who is referred to [facility] office for evaluation of RT index finger and Rt  Middle finger wound. The wound in the Rt index finger started as a swelling when he woke up in the morning in May. He does not recall any inciting incidents like trauma, insect  bite, animal bite, unusual water exposure to that area. Has a hx of carpal tunnel surgery on his Right wrist in 2019, which has reduced sensation in his hand. He works in [employer]. Has one dog at home who is healthy. Denies any dig bite/licking. The wound has remained open since then and became painful afterwards. The wound continued to be deeper and his entire left index finger was swollen prompting him to go to the ED on 9/30. He also had chills and high temperature as well.   He also had a thermal burn on the dorsal aspect of his Right middle finger in August 2021 that healed but then turned into a new open wound on the volar side. Xray at the ED suggestive of Osteomyelitis of the second distal phalanx of rt hand and soft tissue swelling. He was discharged from ED with PO clindamycin PO. Patient was seen by Dr Roney Mansreighton and underwent Partial amputation of the right index finger through the middle phalanx on 10/4 and entire distal phalanx was removed. OR notes from Dr Roney Mansreighton reviewed in detail. OR cx distal phalanx grew MSSA. Path from distal phalanx with findings consistent with osteo while path from middle phalanx with benign bone and fibroconnective tissue. He was switched from Clindamycin to Bactrim since 10/15  as Staph aureus was R to clindamycin.  He was seen by me first time in 10/12 at which time I increased his Bactrim from BID dosing to TID dosing. He was supposed to follow up for a BMP. However, he missed the appointment. He has been taking Bactrim without missing any doses.he had some nausea/vomiting and few episodes of loose stool while on Bactrim but not too bothersome. Denies any fever, chills, sweats and drainage  from the wound.   ROS- 10 point ROS negative except as stated above  Past Medical History:  Diagnosis Date  . Back pain    hx laminectomy  . Hypertension    Past Surgical History:  Procedure Laterality Date  . AMPUTATION Right 04/01/2020   Procedure: Right  index finger partial amputation;  Surgeon: Ernest Mallickreighton, James J III, MD;  [facility];  Service: Orthopedics;  Laterality: Right;  with block  . CARPAL TUNNEL RELEASE Right   . KNEE SURGERY Right   . LAMINECTOMY      Current Outpatient Medications on File Prior to Visit  Medication Sig Dispense Refill  . amphetamine-dextroamphetamine (ADDERALL) 10 MG tablet dextroamphetamine-amphetamine 10 mg tablet    . atenolol (TENORMIN) 25 MG tablet Take 25 mg by mouth daily.     . diazepam (VALIUM) 10 MG tablet diazepam 10 mg tablet    . lisinopril (ZESTRIL) 20 MG tablet Take 20 mg by mouth daily.     . sildenafil (REVATIO) 20 MG tablet sildenafil (pulmonary hypertension) 20 mg tablet    . sulfamethoxazole-trimethoprim (BACTRIM) 400-80 MG tablet Take 1 tablet by mouth in the morning, at noon, and at bedtime. 90 tablet 0  . triamterene-hydrochlorothiazide (MAXZIDE-25) 37.5-25 MG tablet Take 1 tablet by mouth daily.    Marland Kitchen. oxyCODONE-acetaminophen (PERCOCET/ROXICET) 5-325 MG tablet Take 1-2 tablets by mouth every 6 (six) hours as needed for severe pain. (Patient not taking: Reported on 04/09/2020) 25 tablet 0   No current facility-administered medications on file prior to visit.    Allergies  Allergen Reactions  . Bee Venom Anaphylaxis  . Demerol [Meperidine] Anaphylaxis  . Codeine Rash   Social History   Socioeconomic History  . Marital status: Married    Spouse name:  Not on file  . Number of children: Not on file  . Years of education: Not on file  . Highest education level: Not on file  Occupational History  . Not on file  Tobacco Use  . Smoking status: Never Smoker  . Smokeless tobacco: Current User  Vaping Use  . Vaping  Use: Never used  Substance and Sexual Activity  . Alcohol use: Yes    Comment: socially  . Drug use: Never  . Sexual activity: Not on file  Other Topics Concern  . Not on file  Social History Narrative  . Not on file   Social Determinants of Health   Financial Resource Strain:   . Difficulty of Paying Living Expenses: Not on file  Food Insecurity:   . Worried About Programme researcher, broadcasting/film/video in the Last Year: Not on file  . Ran Out of Food in the Last Year: Not on file  Transportation Needs:   . Lack of Transportation (Medical): Not on file  . Lack of Transportation (Non-Medical): Not on file  Physical Activity:   . Days of Exercise per Week: Not on file  . Minutes of Exercise per Session: Not on file  Stress:   . Feeling of Stress : Not on file  Social Connections:   . Frequency of Communication with Friends and Family: Not on file  . Frequency of Social Gatherings with Friends and Family: Not on file  . Attends Religious Services: Not on file  . Active Member of Clubs or Organizations: Not on file  . Attends Banker Meetings: Not on file  . Marital Status: Not on file  Intimate Partner Violence:   . Fear of Current or Ex-Partner: Not on file  . Emotionally Abused: Not on file  . Physically Abused: Not on file  . Sexually Abused: Not on file     Vitals BP (!) 169/99   Pulse 91   Temp 98 F (36.7 C) (Oral)   Ht 6\' 3"  (1.905 m)   Wt 229 lb (103.9 kg)   SpO2 97%   BMI 28.62 kg/m   Examination  General - not in acute distress, comfortably sitting in chair HEENT - PEERLA, no pallor and no icterus Chest - b/l clear air entry, no additional sounds CVS- Normal s1s2, RRR Abdomen - Soft, Non tender , non distended Ext- no pedal edema RT hand    Neuro: grossly normal Back - WNL Psych : calm and cooperative   Recent labs CMP Latest Ref Rng & Units 04/09/2020 04/01/2020  Glucose 65 - 99 mg/dL 06/01/2020) 98  BUN 7 - 25 mg/dL 11 9  Creatinine 737(T - 1.33  mg/dL 0.62 6.94  Sodium 8.54 - 146 mmol/L 136 136  Potassium 3.5 - 5.3 mmol/L 4.5 3.9  Chloride 98 - 110 mmol/L 104 103  CO2 20 - 32 mmol/L 26 23  Calcium 8.6 - 10.3 mg/dL 10.8(H) 10.4(H)   No flowsheet data found.  Pertinent Microbiology Results for orders placed or performed during the hospital encounter of 04/01/20  Surgical pcr screen     Status: Abnormal   Collection Time: 04/01/20 12:35 PM   Specimen: Nasal Mucosa; Nasal Swab  Result Value Ref Range Status   MRSA, PCR NEGATIVE NEGATIVE Final   Staphylococcus aureus POSITIVE (A) NEGATIVE Final    Comment: (NOTE) The Xpert SA Assay (FDA approved for NASAL specimens in patients 14 years of age and older), is one component of a comprehensive surveillance program. It is not  intended to diagnose infection nor to guide or monitor treatment. Performed at [facility], [address], [place], [place] [address]   Fungus Culture With Stain     Status: None   Collection Time: 04/01/20  3:51 PM   Specimen: Soft Tissue, Other  Result Value Ref Range Status   Fungus Stain Final report  Final   Fungus (Mycology) Culture Final report  Final    Comment: (NOTE) Performed At: [facility] [address] [place], [place] [id number] Jolene Schimke MD YN:[id number]    Fungal Source WOUND  Final    Comment: RIGHT FINGER Performed at [facility], [address], [place], [place] [address]   Aerobic/Anaerobic Culture (surgical/deep wound)     Status: None   Collection Time: 04/01/20  3:51 PM   Specimen: Soft Tissue, Other  Result Value Ref Range Status   Specimen Description WOUND RIGHT FINGER  Final   Special Requests PT ON ANCEF  Final   Gram Stain   Final    RARE WBC PRESENT, PREDOMINANTLY MONONUCLEAR NO ORGANISMS SEEN    Culture   Final    No growth aerobically or anaerobically. Performed at [facility], [address], [place], [place] [address]    Report Status 04/06/2020 FINAL  Final  Acid Fast  Culture with reflexed sensitivities     Status: None   Collection Time: 04/01/20  3:51 PM   Specimen: Soft Tissue, Other  Result Value Ref Range Status   Acid Fast Culture Negative  Final    Comment: (NOTE) No acid fast bacilli isolated after 6 weeks. Performed At: [facility] [address] [place], [place] [id number] Jolene Schimke MD WN:[id number]    Source of Sample WOUND  Final    Comment: RIGHT FINGER Performed at [facility], [address] [place], [place] [address]   Acid Fast Smear (AFB)     Status: None   Collection Time: 04/01/20  3:51 PM   Specimen: Soft Tissue, Other  Result Value Ref Range Status   AFB Specimen Processing Concentration  Final   Acid Fast Smear Negative  Final    Comment: (NOTE) Performed At: [facility] [address] [place], [place] [id number] Jolene Schimke MD [id number]    Source (AFB) WOUND  Final    Comment: RIGHT FINGER Performed at [facility], [address], [place], [place] [address]   Fungus Culture Result     Status: None   Collection Time: 04/01/20  3:51 PM  Result Value Ref Range Status   Result 1 Comment  Final    Comment: (NOTE) KOH/Calcofluor preparation:  no fungus observed. Performed At: [facility] [address] [place], [place] [id number] Jolene Schimke MD [id number]   Fungal organism reflex     Status: None   Collection Time: 04/01/20  3:51 PM  Result Value Ref Range Status   Fungal result 1 Comment  Final    Comment: (NOTE) No yeast or mold isolated after 4 weeks. Performed At: [facility] [address] [place], [place] [id number] Jolene Schimke MD [id number]   Aerobic/Anaerobic Culture (surgical/deep wound)     Status: None   Collection Time: 04/01/20  4:15 PM   Specimen: Soft Tissue, Other  Result Value Ref Range Status   Specimen Description TISSUE  Final   Special Requests DISTAL PHALANX PT ON ANCEF  Final   Gram Stain NO WBC SEEN NO  ORGANISMS SEEN   Final  Culture   Final    RARE STAPHYLOCOCCUS AUREUS NO ANAEROBES ISOLATED Performed at [facility], [address], [place], [place] [address]    Report Status 04/06/2020 FINAL  Final   Organism ID, Bacteria STAPHYLOCOCCUS AUREUS  Final      Susceptibility   Staphylococcus aureus - MIC*    CIPROFLOXACIN <=0.5 SENSITIVE Sensitive     ERYTHROMYCIN >=8 RESISTANT Resistant     GENTAMICIN <=0.5 SENSITIVE Sensitive     OXACILLIN <=0.25 SENSITIVE Sensitive     TETRACYCLINE <=1 SENSITIVE Sensitive     VANCOMYCIN <=0.5 SENSITIVE Sensitive     TRIMETH/SULFA <=10 SENSITIVE Sensitive     CLINDAMYCIN RESISTANT Resistant     RIFAMPIN <=0.5 SENSITIVE Sensitive     Inducible Clindamycin POSITIVE Resistant     * RARE STAPHYLOCOCCUS AUREUS    Pertinent Imaging Xray RT Hand 03/28/20   FINDINGS: Osteolytic changes involving the tuft and distal shaft of the second distal phalanx with bony fragmentation. No fracture or malalignment. Positive for soft tissue swelling.  IMPRESSION: Osteolytic changes with fragmentation of the tuft and distal shaft of the second distal phalanx concerning for osteomyelitis  All pertinent labs/Imagings/notes reviewed. All pertinent plain films and CT images have been personally visualized and interpreted; radiology reports have been reviewed. Decision making incorporated into the Impression / Recommendations.

## 2020-05-22 NOTE — Assessment & Plan Note (Signed)
Continue wound care at the RT middle finger Will DC bactrim today

## 2020-05-22 NOTE — Assessment & Plan Note (Signed)
Completed approx 6 weeks of bactrim. Amputation site has healed. No need for further abx

## 2020-05-22 NOTE — Telephone Encounter (Addendum)
Faxed signed medical records release form to Dr. Roney Mans with [facility] requesting 05/14/20 office note and most recent Xray per Dr. Elinor Parkinson. Requested records be faxed to [phone]. Received receipt of successful fax transmission.   [facility] Fax: [phone]  Sandie Ano, RN

## 2020-05-23 LAB — BASIC METABOLIC PANEL
BUN: 13 mg/dL (ref 7–25)
CO2: 22 mmol/L (ref 20–32)
Calcium: 10.6 mg/dL — ABNORMAL HIGH (ref 8.6–10.3)
Chloride: 104 mmol/L (ref 98–110)
Creat: 0.83 mg/dL (ref 0.70–1.33)
Glucose, Bld: 107 mg/dL — ABNORMAL HIGH (ref 65–99)
Potassium: 4.7 mmol/L (ref 3.5–5.3)
Sodium: 134 mmol/L — ABNORMAL LOW (ref 135–146)

## 2020-05-23 LAB — CBC
HCT: 48.7 % (ref 38.5–50.0)
Hemoglobin: 17 g/dL (ref 13.2–17.1)
MCH: 32.3 pg (ref 27.0–33.0)
MCHC: 34.9 g/dL (ref 32.0–36.0)
MCV: 92.6 fL (ref 80.0–100.0)
MPV: 9.4 fL (ref 7.5–12.5)
Platelets: 241 10*3/uL (ref 140–400)
RBC: 5.26 10*6/uL (ref 4.20–5.80)
RDW: 13 % (ref 11.0–15.0)
WBC: 6.8 10*3/uL (ref 3.8–10.8)

## 2020-06-04 NOTE — Telephone Encounter (Signed)
Ok. Thanks!

## 2020-06-04 NOTE — Telephone Encounter (Signed)
Received faxed records. Placed in Dr. Leafy Half box for review.   Sandie Ano, RN

## 2020-06-14 ENCOUNTER — Other Ambulatory Visit: Payer: Self-pay

## 2020-06-14 ENCOUNTER — Inpatient Hospital Stay (HOSPITAL_COMMUNITY)
Admission: EM | Admit: 2020-06-14 | Discharge: 2020-06-18 | DRG: 541 | Disposition: A | Discharge status: Home Health | Payer: Self-pay | Attending: Internal Medicine | Admitting: Internal Medicine

## 2020-06-14 ENCOUNTER — Ambulatory Visit (INDEPENDENT_AMBULATORY_CARE_PROVIDER_SITE_OTHER): Payer: Self-pay | Admitting: Infectious Diseases

## 2020-06-14 ENCOUNTER — Encounter: Payer: Self-pay | Admitting: Infectious Diseases

## 2020-06-14 VITALS — BP 157/103 | HR 86 | Temp 98.3°F | Resp 16 | Ht 75.0 in | Wt 228.4 lb

## 2020-06-14 DIAGNOSIS — Z89021 Acquired absence of right finger(s): Secondary | ICD-10-CM

## 2020-06-14 DIAGNOSIS — F419 Anxiety disorder, unspecified: Secondary | ICD-10-CM | POA: Diagnosis present

## 2020-06-14 DIAGNOSIS — Z833 Family history of diabetes mellitus: Secondary | ICD-10-CM

## 2020-06-14 DIAGNOSIS — Z79899 Other long term (current) drug therapy: Secondary | ICD-10-CM

## 2020-06-14 DIAGNOSIS — S61200S Unspecified open wound of right index finger without damage to nail, sequela: Secondary | ICD-10-CM

## 2020-06-14 DIAGNOSIS — M869 Osteomyelitis, unspecified: Secondary | ICD-10-CM | POA: Diagnosis present

## 2020-06-14 DIAGNOSIS — Z885 Allergy status to narcotic agent status: Secondary | ICD-10-CM

## 2020-06-14 DIAGNOSIS — Z20822 Contact with and (suspected) exposure to covid-19: Secondary | ICD-10-CM | POA: Diagnosis present

## 2020-06-14 DIAGNOSIS — R7401 Elevation of levels of liver transaminase levels: Secondary | ICD-10-CM | POA: Diagnosis present

## 2020-06-14 DIAGNOSIS — F909 Attention-deficit hyperactivity disorder, unspecified type: Secondary | ICD-10-CM | POA: Diagnosis present

## 2020-06-14 DIAGNOSIS — Z8614 Personal history of Methicillin resistant Staphylococcus aureus infection: Secondary | ICD-10-CM

## 2020-06-14 DIAGNOSIS — I10 Essential (primary) hypertension: Secondary | ICD-10-CM | POA: Diagnosis present

## 2020-06-14 DIAGNOSIS — T23321S Burn of third degree of single right finger (nail) except thumb, sequela: Secondary | ICD-10-CM

## 2020-06-14 DIAGNOSIS — Z720 Tobacco use: Secondary | ICD-10-CM

## 2020-06-14 DIAGNOSIS — Z91030 Bee allergy status: Secondary | ICD-10-CM

## 2020-06-14 DIAGNOSIS — G5601 Carpal tunnel syndrome, right upper limb: Secondary | ICD-10-CM | POA: Diagnosis present

## 2020-06-14 DIAGNOSIS — M868X4 Other osteomyelitis, hand: Principal | ICD-10-CM | POA: Diagnosis present

## 2020-06-14 DIAGNOSIS — G8929 Other chronic pain: Secondary | ICD-10-CM | POA: Diagnosis present

## 2020-06-14 LAB — COMPREHENSIVE METABOLIC PANEL
ALT: 107 U/L — ABNORMAL HIGH (ref 0–44)
AST: 63 U/L — ABNORMAL HIGH (ref 15–41)
Albumin: 3.8 g/dL (ref 3.5–5.0)
Alkaline Phosphatase: 57 U/L (ref 38–126)
Anion gap: 9 (ref 5–15)
BUN: 6 mg/dL (ref 6–20)
CO2: 24 mmol/L (ref 22–32)
Calcium: 10.5 mg/dL — ABNORMAL HIGH (ref 8.9–10.3)
Chloride: 104 mmol/L (ref 98–111)
Creatinine, Ser: 0.94 mg/dL (ref 0.61–1.24)
GFR, Estimated: >60 mL/min (ref >60)
Glucose, Bld: 117 mg/dL — ABNORMAL HIGH (ref 70–99)
Potassium: 3.9 mmol/L (ref 3.5–5.1)
Sodium: 137 mmol/L (ref 135–145)
Total Bilirubin: 0.8 mg/dL (ref 0.3–1.2)
Total Protein: 6.9 g/dL (ref 6.5–8.1)

## 2020-06-14 LAB — CBC WITH DIFFERENTIAL/PLATELET
Abs Immature Granulocytes: 0.03 10*3/uL (ref 0.00–0.07)
Basophils Absolute: 0 10*3/uL (ref 0.0–0.1)
Basophils Relative: 1 %
Eosinophils Absolute: 0.1 10*3/uL (ref 0.0–0.5)
Eosinophils Relative: 2 %
HCT: 48.2 % (ref 39.0–52.0)
Hemoglobin: 16.1 g/dL (ref 13.0–17.0)
Immature Granulocytes: 0 %
Lymphocytes Relative: 28 %
Lymphs Abs: 2 10*3/uL (ref 0.7–4.0)
MCH: 31.4 pg (ref 26.0–34.0)
MCHC: 33.4 g/dL (ref 30.0–36.0)
MCV: 94.1 fL (ref 80.0–100.0)
Monocytes Absolute: 0.6 10*3/uL (ref 0.1–1.0)
Monocytes Relative: 8 %
Neutro Abs: 4.4 10*3/uL (ref 1.7–7.7)
Neutrophils Relative %: 61 %
Platelets: 234 10*3/uL (ref 150–400)
RBC: 5.12 MIL/uL (ref 4.22–5.81)
RDW: 12.9 % (ref 11.5–15.5)
WBC: 7.2 10*3/uL (ref 4.0–10.5)
nRBC: 0 % (ref 0.0–0.2)

## 2020-06-14 LAB — LACTIC ACID, PLASMA: Lactic Acid, Venous: 1.9 mmol/L (ref 0.5–1.9)

## 2020-06-14 LAB — RESP PANEL BY RT-PCR (FLU A&B, COVID) ARPGX2
Influenza A by PCR: NEGATIVE
Influenza B by PCR: NEGATIVE
SARS Coronavirus 2 by RT PCR: NEGATIVE

## 2020-06-14 LAB — APTT: aPTT: 27 seconds (ref 24–36)

## 2020-06-14 LAB — PROTIME-INR
INR: 1.1 (ref 0.8–1.2)
Prothrombin Time: 13.5 seconds (ref 11.4–15.2)

## 2020-06-14 MED ORDER — VANCOMYCIN HCL 1500 MG/300ML IV SOLN
1500.0000 mg | Freq: Two times a day (BID) | INTRAVENOUS | Status: DC
  Administered 2020-06-15 – 2020-06-18 (×6): 1500 mg via INTRAVENOUS
  Filled 2020-06-14 (×6): qty 300

## 2020-06-14 MED ORDER — VANCOMYCIN HCL 2000 MG/400ML IV SOLN
2000.0000 mg | Freq: Once | INTRAVENOUS | Status: AC
  Administered 2020-06-14: 2000 mg via INTRAVENOUS
  Filled 2020-06-14: qty 400

## 2020-06-14 MED ORDER — OXYCODONE-ACETAMINOPHEN 5-325 MG PO TABS
1.0000 | ORAL_TABLET | Freq: Once | ORAL | Status: AC
  Administered 2020-06-14: 23:00:00 1 via ORAL
  Filled 2020-06-14: qty 1

## 2020-06-14 MED ORDER — SODIUM CHLORIDE 0.9 % IV SOLN
2.0000 g | INTRAVENOUS | Status: DC
  Administered 2020-06-14 – 2020-06-17 (×4): 2 g via INTRAVENOUS
  Filled 2020-06-14 (×4): qty 20

## 2020-06-14 NOTE — Progress Notes (Signed)
Pharmacy Antibiotic Note  Jacob Sanchez is a 54 y.o. male admitted on 06/14/2020 with surgical prophylaxis and Osteomyelitis.  Pharmacy has been consulted for Vancomycin dosing.   Height: 6\' 3"  (190.5 cm) Weight: 102.1 kg (225 lb) IBW/kg (Calculated) : 84.5  Temp (24hrs), Avg:98.3 F (36.8 C), Min:98.2 F (36.8 C), Max:98.6 F (37 C)  Recent Labs  Lab 06/14/20 1326  WBC 7.2  CREATININE 0.94  LATICACIDVEN 1.9    Estimated Creatinine Clearance: 114.9 mL/min (by C-G formula based on SCr of 0.94 mg/dL).    Allergies  Allergen Reactions  . Bee Venom Anaphylaxis  . Demerol [Meperidine] Anaphylaxis  . Codeine Rash    Antimicrobials this admission: 12/17 Ceftriaxone >>  12/17 Vancomycin >>   Dose adjustments this admission: N/a  Microbiology results: Pending   Plan:  - Vancomycin 2000mg  IV x 1 dose  - Followed by Vancomycin 1500mg  IV q12h - Goal trough ~15 - Monitor patients renal function and urine output  - De-escalate ABX when appropriate   Thank you for allowing pharmacy to be a part of this patient's care.  1/18 PharmD. BCPS 06/14/2020 10:59 PM

## 2020-06-14 NOTE — ED Provider Notes (Signed)
[facility] EMERGENCY DEPARTMENT Provider Note   CSN: [id number] Arrival date & time: 06/14/20  1240     History Chief Complaint  Patient presents with   Osteomyelitis     Jacob Sanchez is a 54 y.o. male.  54 y/o male with hx of HTN presents to the ED for admission for management of osteomyelitis of the R 3rd finger. Previously underwent amputation of the R 2nd finger on 04/01/20 by Dr. Roney Mans for osteomyelitis of the distal tip of the digit. Culture positive for MSSA sensitive to Bactrim and patient completed 6 week course. Had initial thermal injury to R 3rd digit which healed until digit subsequently developed non-healing wounds to the volar aspect. Has continued to be followed OP by orthopedics who completed an MRI on 06/12/20 with findings c/w osteomyelitis. Was referred to his ID physician, Dr. Elinor Parkinson, for non-operative management who wanted to start IV abx. Unable to complete this heading into the weekend so sent the patient to the ED for PICC placement and IV abx. Patient c/o pain at tip of affected finger. No fevers.  The history is provided by the patient. No language interpreter was used.       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  Social History   Tobacco Use   Smoking status: Never Smoker   Smokeless tobacco: Current User  Vaping Use   Vaping Use: Never used  Substance Use Topics   Alcohol use: Yes    Comment: socially   Drug use: Never    Home Medications Prior to Admission medications   Medication Sig Start Date End Date Taking? Authorizing Provider  acetaminophen (TYLENOL) 500 MG tablet Take 1,000 mg by mouth every 6 (six) hours as needed for mild pain.   Yes [provider]  amphetamine-dextroamphetamine (ADDERALL) 10 MG tablet Take 10 mg by mouth daily with breakfast. 10/25/17  Yes [provider]  atenolol (TENORMIN) 25 MG tablet Take 25 mg by mouth daily.    Yes [provider]  diazepam (VALIUM) 10 MG tablet Take 10 mg by mouth daily as needed for anxiety. 12/08/17  Yes [provider]  lisinopril (ZESTRIL) 10 MG tablet Take 10 mg by mouth daily.   Yes [provider]  sildenafil (REVATIO) 20 MG tablet Take 20 mg by mouth daily as needed (for ED).   Yes [provider]  triamterene-hydrochlorothiazide (MAXZIDE-25) 37.5-25 MG tablet Take 1 tablet by mouth daily.   Yes [provider]    Allergies    Bee  venom, Demerol [meperidine], and Codeine  Review of Systems   Review of Systems  Ten systems reviewed and are negative for acute change, except as noted in the HPI.    Physical Exam Updated Vital Signs BP (!) 162/105    Pulse 75    Temp 98.2 F (36.8 C) (Oral)    Resp 20    Ht 6\' 3"  (1.905 m)    Wt 102.1 kg    SpO2 99%    BMI 28.12 kg/m   Physical Exam Vitals and nursing note reviewed.  Constitutional:      General: He is not in acute distress.    Appearance: He is well-developed and well-nourished. He is not diaphoretic.     Comments:  Nontoxic appearing and in NAD  HENT:     Head: Normocephalic and atraumatic.  Eyes:     General: No scleral icterus.    Extraocular Movements: EOM normal.     Conjunctiva/sclera: Conjunctivae normal.  Cardiovascular:     Rate and Rhythm: Normal rate and regular rhythm.     Pulses: Normal pulses.     Comments: Distal radial pulse 2+ in the RUE Pulmonary:     Effort: Pulmonary effort is normal. No respiratory distress.     Comments: Respirations even and unlabored Musculoskeletal:        General: Normal range of motion.       Hands:     Cervical back: Normal range of motion.     Comments: Prior amputation of the R 2nd finger at middle phalanx.  Skin:    General: Skin is warm and dry.     Coloration: Skin is not pale.     Findings: No erythema or rash.  Neurological:     Mental Status: He is alert and oriented to person, place, and time.     Coordination: Coordination normal.  Psychiatric:        Mood and Affect: Mood and affect normal.        Behavior: Behavior normal.     ED Results / Procedures / Treatments   Labs (all labs ordered are listed, but only abnormal results are displayed) Labs Reviewed  COMPREHENSIVE METABOLIC PANEL - Abnormal; Notable for the following components:      Result Value   Glucose, Bld 117 (*)    Calcium 10.5 (*)    AST 63 (*)    ALT 107 (*)    All other components within normal limits  RESP PANEL BY RT-PCR (FLU A&B, COVID) ARPGX2  CULTURE, BLOOD (ROUTINE X 2)  CULTURE, BLOOD (ROUTINE X 2)  CBC WITH DIFFERENTIAL/PLATELET  APTT  PROTIME-INR  LACTIC ACID, PLASMA  LACTIC ACID, PLASMA  C-REACTIVE PROTEIN  SEDIMENTATION RATE    EKG None  Radiology No results found.      Procedures Procedures (including critical care time)  Medications Ordered in ED Medications  cefTRIAXone (ROCEPHIN) 2 g in sodium chloride 0.9 % 100 mL IVPB (0 g Intravenous Stopped 06/14/20 2356)  vancomycin (VANCOREADY) IVPB 2000 mg/400 mL (2,000 mg  Intravenous New Bag/Given 06/14/20 2357)    Followed by  vancomycin (VANCOREADY) IVPB 1500 mg/300 mL (has no administration in time range)  oxyCODONE-acetaminophen (PERCOCET/ROXICET) 5-325 MG per tablet 1 tablet (1 tablet Oral Given 06/14/20 2233)    ED Course  I have reviewed the triage vital signs and the nursing notes.  Pertinent labs & imaging results that were available during my care of the patient were reviewed by me and considered in my  medical decision making (see chart for details).  Clinical Course as of 06/15/20 0028  Fri Jun 14, 2020  2241 Spoke with Dr. Aundria Rud of [facility]. Recommends starting abx in the ED; no need to hold for biopsy. Will put patient on list to be assessed in consultation. [KH]    Clinical Course User Index [KH] Darylene Price   MDM Rules/Calculators/A&P                          54 year old male presents to the emergency department for assessment and management of osteomyelitis of the right third digit.  This was diagnosed on MRI as an outpatient.  Advised to come to the ED for admission by his ID physician for placement of a PICC line and to initiate IV antibiotics.  Previously underwent amputation of the right second digit secondary to osteomyelitis.  Consult placed to orthopedics who will assess the patient while inpatient.  Started on IV antibiotics.  [facility].   Final Clinical Impression(s) / ED Diagnoses Final diagnoses:  Osteomyelitis of finger of right hand [facility])    Rx / DC Orders ED Discharge Orders    None       Antony Madura, PA-C 06/15/20 0029    Blane Ohara, MD 06/17/20 1256

## 2020-06-14 NOTE — ED Triage Notes (Signed)
Pt sent here for osteomyelitis of R index finger. Pt seen by infectious disease and had MRI earlier this week. Pt sent here by MD for picc line and ABX.

## 2020-06-14 NOTE — Progress Notes (Signed)
[facility]                                                             [address], [place], [place], [address]                                                                  Phn. 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  Plan Given MRI findings done at [facility] showing OM of Rt distal phalanx, patient will need to be treated with prolonged duration of antibiotics. I do not have any cultures to target antibiotics currently.  I discussed with him regarding outpatient treatment plan with PICC line and IV abx, the earliest PICC placement along with Insurance approval for IV medications will be next Tuesday and he will need to  take broad spectrum oral antibiotics till IV medications can be started sometime next week.  Next option would be for him to go to the hospital right away to get IV antibiotics started and PICC line placed. He preferred to go to the hospital for immediate start of IV abx which I agree with. I also gave him a copy of MRI done at [facility] to take with him.   Please consult ID for long term antibiotics when he is admitted.   I will follow him in the clinic.   All questions and concerns were discussed and addressed.    Odette Fraction, MD [facility]  Office phone [phone] Fax no.  [phone] ______________________________________________________________________________________________________________________ Subjective Patient was last seen by me in 05/22/20 for Rt forefinger osteomyelitis and had completed approx 6 weeks of treatment with Bactrim. He also had a superficial wound in his middle finger at that time which appeared to be healing at that time. However, this wound was followed with Dr  Creighton with Xray reportedly done which did not show bone infection. However, he was referred today from Dr Hinda Glatterreighton's office after MRI was done which showed osteomyelitis of rt middle finger. No surgical intervention or cultures are available at this time.  Patient complains of excessive pain at the rt middle finger and said the wound did not heal. He denies any drainage from the area. Denies any trauma/insect bite etc. He says the skin just sloughed up by itself. He feels his rt hand is more numb. He was told that his numbness in the rt hand is due to Carpal tunnel syndrome but he thinks it is getting worse. Denies taking any antibiotics.   ROS- 11 point ROS negative except as stated above  Past Medical History:  Diagnosis Date   Back pain    hx laminectomy   Hypertension    Past Surgical History:  Procedure Laterality Date   AMPUTATION Right 04/01/2020   Procedure: Right  index finger partial amputation;  Surgeon: Ernest Mallickreighton, James J III, MD;  Location: [facility] OR;  Service: Orthopedics;  Laterality: Right;  with block   CARPAL TUNNEL RELEASE Right    KNEE SURGERY Right    LAMINECTOMY     No current facility-administered medications on file prior to visit.   Current Outpatient Medications on File Prior to Visit  Medication Sig Dispense Refill   amphetamine-dextroamphetamine (ADDERALL) 10 MG tablet Take 10 mg by mouth daily with breakfast.     atenolol (TENORMIN) 25 MG tablet Take 25 mg by mouth daily.      diazepam (VALIUM) 10 MG tablet Take 10 mg by mouth daily  as needed for anxiety.     lisinopril (ZESTRIL) 10 MG tablet Take 10 mg by mouth daily.     sildenafil (REVATIO) 20 MG tablet Take 20 mg by mouth daily as needed (for ED).     triamterene-hydrochlorothiazide (MAXZIDE-25) 37.5-25 MG tablet Take 1 tablet by mouth daily.     Allergies  Allergen Reactions   Bee Venom Anaphylaxis   Demerol [Meperidine] Anaphylaxis   Codeine Rash   Social History   Socioeconomic History   Marital status: Single    Spouse name: Not on file   Number of children: 2   Years of education: Not on file   Highest education level: Not on file  Occupational History   Occupation: Holiday representativeConstruction  Tobacco Use   Smoking status: Never Smoker   Smokeless tobacco: Current User    Types: Snuff  Vaping Use   Vaping Use: Never used  Substance and Sexual Activity   Alcohol use: Yes    Comment: socially   Drug use: Never   Sexual activity: Yes    Partners: Female  Other Topics Concern   Not on file  Social History Narrative   Not on file   Social Determinants of Health   Financial Resource Strain: Not on file  Food Insecurity: Not on file  Transportation Needs: Not on file  Physical Activity: Not on file  Stress: Not on file  Social Connections: Not on file  Intimate Partner Violence: Not on file    Vitals BP (!) 157/103    Pulse 86    Temp 98.3 F (36.8 C) (Oral)    Resp 16    Ht 6\' 3"  (1.905 m)    Wt 228 lb 6.4 oz (103.6 kg)    SpO2 99%    BMI 28.55 kg/m   Examination  General - not in acute distress, comfortably sitting in  chair HEENT - PEERLA, no pallor and no icterus Chest - b/l clear air entry, no additional sounds CVS- Normal s1s2, RRR Abdomen - Soft, Non tender , non distended Ext- no pedal edema RT hand    Neuro: grossly normal Back - WNL Psych : calm and cooperative   Recent labs CMP Latest Ref Rng & Units 06/15/2020 06/14/2020 05/22/2020  Glucose 70 - 99 mg/dL 161(W) 960(A) 540(J)  BUN 6 - 20 mg/dL 10 6 13    Creatinine 0.61 - 1.24 mg/dL 8.11 9.14  Sodium 135 - 145 mmol/L 135 137 134(L)  Potassium 3.5 - 5.1 mmol/L 3.6 3.9 4.7  Chloride 98 - 111 mmol/L 104 104 104  CO2 22 - 32 mmol/L 24 24 22   Calcium 8.9 - 10.3 mg/dL 7.82 10.5(H) 10.6(H)  Total Protein 6.5 - 8.1 g/dL 6.2(L) 6.9 -  Total Bilirubin 0.3 - 1.2 mg/dL 0.7 0.8 -  Alkaline Phos 38 - 126 U/L 51 57 -  AST 15 - 41 U/L 42(H) 63(H) -  ALT 0 - 44 U/L 81(H) 107(H) -   CBC Latest Ref Rng & Units 06/15/2020 06/14/2020 05/22/2020  WBC 4.0 - 10.5 K/uL 7.4 7.2 6.8  Hemoglobin 13.0 - 17.0 g/dL 06/16/2020 05/24/2020 21.3  Hematocrit 39.0 - 52.0 % 43.2 48.2 48.7  Platelets 150 - 400 K/uL 206 234 241    Pertinent Microbiology Results for orders placed or performed during the hospital encounter of 04/01/20  Surgical pcr screen     Status: Abnormal   Collection Time: 04/01/20 12:35 PM   Specimen: Nasal Mucosa; Nasal Swab  Result Value Ref Range Status   MRSA, PCR NEGATIVE NEGATIVE Final   Staphylococcus aureus POSITIVE (A) NEGATIVE Final    Comment: (NOTE) The Xpert SA Assay (FDA approved for NASAL specimens in patients 76 years of age and older), is one component of a comprehensive surveillance program. It is not intended to diagnose infection nor to guide or monitor treatment. Performed at [facility], [address], [place], [address] [place]   Fungus Culture With Stain     Status: None   Collection Time: 04/01/20  3:51 PM   Specimen: Soft Tissue, Other  Result Value Ref Range Status   Fungus Stain Final report  Final   Fungus (Mycology) Culture Final report  Final    Comment: (NOTE) Performed At: [facility] [address] [place], [address] [place] [id number]    Fungal Source WOUND  Final    Comment: RIGHT FINGER Performed at [facility], [address], [place], [address] [place]   Aerobic/Anaerobic Culture (surgical/deep wound)     Status: None   Collection Time: 04/01/20  3:51 PM    Specimen: Soft Tissue, Other  Result Value Ref Range Status   Specimen Description WOUND RIGHT FINGER  Final   Special Requests PT ON ANCEF  Final   Gram Stain   Final    RARE WBC PRESENT, PREDOMINANTLY MONONUCLEAR NO ORGANISMS SEEN    Culture   Final    No growth aerobically or anaerobically. Performed at [facility], [address] [place], [address] [place]    Report Status 04/06/2020 FINAL  Final  Acid Fast Culture with reflexed sensitivities     Status: None   Collection Time: 04/01/20  3:51 PM   Specimen: Soft Tissue, Other  Result Value Ref Range Status   Acid Fast Culture Negative  Final    Comment: (NOTE) No acid fast  bacilli isolated after 6 weeks. Performed At: [facility] [address] [place], [place] [id number] Jolene Schimke MD KG:[id number]    Source of Sample WOUND  Final    Comment: RIGHT FINGER Performed at [facility], [address], [place], [place] [address]   Acid Fast Smear (AFB)     Status: None   Collection Time: 04/01/20  3:51 PM   Specimen: Soft Tissue, Other  Result Value Ref Range Status   AFB Specimen Processing Concentration  Final   Acid Fast Smear Negative  Final    Comment: (NOTE) Performed At: [facility] [address] [place], [place] [id number] Jolene Schimke MD OV:[id number]    Source (AFB) WOUND  Final    Comment: RIGHT FINGER Performed at [facility], [address] [place], [place] [address]   Fungus Culture Result     Status: None   Collection Time: 04/01/20  3:51 PM  Result Value Ref Range Status   Result 1 Comment  Final    Comment: (NOTE) KOH/Calcofluor preparation:  no fungus observed. Performed At: [facility] [address] [place], [place] [id number] Jolene Schimke MD XN:[id number]   Fungal organism reflex     Status: None   Collection Time: 04/01/20  3:51 PM  Result Value Ref Range Status   Fungal result 1 Comment  Final    Comment: (NOTE) No yeast or  mold isolated after 4 weeks. Performed At: [facility] [address] [place], [place] [id number] Jolene Schimke MD SE:[id number]   Aerobic/Anaerobic Culture (surgical/deep wound)     Status: None   Collection Time: 04/01/20  4:15 PM   Specimen: Soft Tissue, Other  Result Value Ref Range Status   Specimen Description TISSUE  Final   Special Requests DISTAL PHALANX PT ON ANCEF  Final   Gram Stain NO WBC SEEN NO ORGANISMS SEEN   Final   Culture   Final    RARE STAPHYLOCOCCUS AUREUS NO ANAEROBES ISOLATED Performed at [facility], [address], [place], [place] [address]    Report Status 04/06/2020 FINAL  Final   Organism ID, Bacteria STAPHYLOCOCCUS AUREUS  Final      Susceptibility   Staphylococcus aureus - MIC*    CIPROFLOXACIN <=0.5 SENSITIVE Sensitive     ERYTHROMYCIN >=8 RESISTANT Resistant     GENTAMICIN <=0.5 SENSITIVE Sensitive     OXACILLIN <=0.25 SENSITIVE Sensitive     TETRACYCLINE <=1 SENSITIVE Sensitive     VANCOMYCIN <=0.5 SENSITIVE Sensitive     TRIMETH/SULFA <=10 SENSITIVE Sensitive     CLINDAMYCIN RESISTANT Resistant     RIFAMPIN <=0.5 SENSITIVE Sensitive     Inducible Clindamycin POSITIVE Resistant     * RARE STAPHYLOCOCCUS AUREUS    Pertinent Imaging      All pertinent labs/Imagings/notes reviewed. All pertinent plain films and CT images have been personally visualized and interpreted; radiology reports have been reviewed. Decision making incorporated into the Impression / Recommendations.  I spent greater than 30  minutes with the patient including greater than 50% of time in face to face counsel of the patient and in coordination of their care.

## 2020-06-15 ENCOUNTER — Encounter (HOSPITAL_COMMUNITY): Payer: Self-pay | Admitting: Internal Medicine

## 2020-06-15 ENCOUNTER — Observation Stay: Payer: Self-pay

## 2020-06-15 DIAGNOSIS — G5601 Carpal tunnel syndrome, right upper limb: Secondary | ICD-10-CM

## 2020-06-15 DIAGNOSIS — A4901 Methicillin susceptible Staphylococcus aureus infection, unspecified site: Secondary | ICD-10-CM

## 2020-06-15 DIAGNOSIS — M869 Osteomyelitis, unspecified: Secondary | ICD-10-CM

## 2020-06-15 LAB — CBC
HCT: 43.2 % (ref 39.0–52.0)
Hemoglobin: 15.5 g/dL (ref 13.0–17.0)
MCH: 33 pg (ref 26.0–34.0)
MCHC: 35.9 g/dL (ref 30.0–36.0)
MCV: 91.9 fL (ref 80.0–100.0)
Platelets: 206 10*3/uL (ref 150–400)
RBC: 4.7 MIL/uL (ref 4.22–5.81)
RDW: 13.2 % (ref 11.5–15.5)
WBC: 7.4 10*3/uL (ref 4.0–10.5)
nRBC: 0 % (ref 0.0–0.2)

## 2020-06-15 LAB — HIV ANTIBODY (ROUTINE TESTING W REFLEX): HIV Screen 4th Generation wRfx: NONREACTIVE

## 2020-06-15 LAB — COMPREHENSIVE METABOLIC PANEL
ALT: 81 U/L — ABNORMAL HIGH (ref 0–44)
AST: 42 U/L — ABNORMAL HIGH (ref 15–41)
Albumin: 3.4 g/dL — ABNORMAL LOW (ref 3.5–5.0)
Alkaline Phosphatase: 51 U/L (ref 38–126)
Anion gap: 7 (ref 5–15)
BUN: 10 mg/dL (ref 6–20)
CO2: 24 mmol/L (ref 22–32)
Calcium: 10.1 mg/dL (ref 8.9–10.3)
Chloride: 104 mmol/L (ref 98–111)
Creatinine, Ser: 0.95 mg/dL (ref 0.61–1.24)
GFR, Estimated: >60 mL/min (ref >60)
Glucose, Bld: 117 mg/dL — ABNORMAL HIGH (ref 70–99)
Potassium: 3.6 mmol/L (ref 3.5–5.1)
Sodium: 135 mmol/L (ref 135–145)
Total Bilirubin: 0.7 mg/dL (ref 0.3–1.2)
Total Protein: 6.2 g/dL — ABNORMAL LOW (ref 6.5–8.1)

## 2020-06-15 LAB — SEDIMENTATION RATE: Sed Rate: 2 mm/hr (ref 0–16)

## 2020-06-15 LAB — PROCALCITONIN: Procalcitonin: <0.1 ng/mL

## 2020-06-15 LAB — LACTIC ACID, PLASMA: Lactic Acid, Venous: 1.8 mmol/L (ref 0.5–1.9)

## 2020-06-15 LAB — C-REACTIVE PROTEIN: CRP: 0.6 mg/dL (ref <1.0)

## 2020-06-15 MED ORDER — SODIUM CHLORIDE 0.9% FLUSH
10.0000 mL | INTRAVENOUS | Status: DC | PRN
  Administered 2020-06-15 – 2020-06-18 (×2): 10 mL

## 2020-06-15 MED ORDER — SODIUM CHLORIDE 0.9% FLUSH
10.0000 mL | Freq: Two times a day (BID) | INTRAVENOUS | Status: DC
  Administered 2020-06-15 – 2020-06-18 (×4): 10 mL

## 2020-06-15 MED ORDER — ADULT MULTIVITAMIN W/MINERALS CH
1.0000 | ORAL_TABLET | Freq: Every day | ORAL | Status: DC
  Administered 2020-06-15 – 2020-06-18 (×4): 1 via ORAL
  Filled 2020-06-15 (×4): qty 1

## 2020-06-15 MED ORDER — LISINOPRIL 10 MG PO TABS
10.0000 mg | ORAL_TABLET | Freq: Every day | ORAL | Status: DC
  Administered 2020-06-15 – 2020-06-18 (×4): 10 mg via ORAL
  Filled 2020-06-15 (×5): qty 1

## 2020-06-15 MED ORDER — SODIUM CHLORIDE 0.9 % IV SOLN: INTRAVENOUS | Status: DC

## 2020-06-15 MED ORDER — CHLORHEXIDINE GLUCONATE CLOTH 2 % EX PADS
6.0000 | MEDICATED_PAD | Freq: Every day | CUTANEOUS | Status: DC
  Administered 2020-06-15 – 2020-06-18 (×4): 6 via TOPICAL

## 2020-06-15 MED ORDER — DIAZEPAM 5 MG PO TABS: 10.0000 mg | ORAL_TABLET | Freq: Every day | ORAL | Status: DC | PRN

## 2020-06-15 MED ORDER — OXYCODONE-ACETAMINOPHEN 5-325 MG PO TABS
1.0000 | ORAL_TABLET | Freq: Four times a day (QID) | ORAL | Status: DC | PRN
  Administered 2020-06-15 – 2020-06-18 (×6): 1 via ORAL
  Filled 2020-06-15 (×6): qty 1

## 2020-06-15 MED ORDER — ATENOLOL 25 MG PO TABS
25.0000 mg | ORAL_TABLET | Freq: Every day | ORAL | Status: DC
  Administered 2020-06-15 – 2020-06-18 (×4): 25 mg via ORAL
  Filled 2020-06-15 (×4): qty 1

## 2020-06-15 MED ORDER — TRIAMTERENE-HCTZ 37.5-25 MG PO TABS
1.0000 | ORAL_TABLET | Freq: Every day | ORAL | Status: DC
  Administered 2020-06-15 – 2020-06-16 (×2): 1 via ORAL
  Filled 2020-06-15 (×3): qty 1

## 2020-06-15 MED ORDER — ENOXAPARIN SODIUM 40 MG/0.4ML ~~LOC~~ SOLN
40.0000 mg | Freq: Every day | SUBCUTANEOUS | Status: DC
  Administered 2020-06-15 – 2020-06-18 (×4): 40 mg via SUBCUTANEOUS
  Filled 2020-06-15 (×4): qty 0.4

## 2020-06-15 NOTE — Consult Note (Signed)
Date of Admission:  06/14/2020          Reason for Consult: Osteomyelitis of middle finger    Referring Provider: Dr. Waymon Amato   Assessment:   1. Osteomyelitis of middle finger  2. Previous osteomyelitis of adjacent index finger status post amputation with MSSA being isolated on culture 3. Struve prior MRSA infection 4. Significant numbness in his right hand due to untreated carpal tunnel syndrome 5. Laminectomy for foot drop  Plan:  1. We will continue ceftriaxone and vancomycin for now (unfortunately because he does not have insurance at present we will need to rely on charity care) my preference would be for daptomycin and ceftriaxone but I am not sure if charity care will cover this 2. Check sed rate and CRP 3. Place PICC line  Active Problems:   Osteomyelitis (HCC)   Scheduled Meds: . atenolol  25 mg Oral Daily  . enoxaparin (LOVENOX) injection  40 mg Subcutaneous Daily  . lisinopril  10 mg Oral Daily  . multivitamin with minerals  1 tablet Oral Daily  . triamterene-hydrochlorothiazide  1 tablet Oral Daily   Continuous Infusions: . sodium chloride 50 mL/hr at 06/15/20 0248  . cefTRIAXone (ROCEPHIN)  IV Stopped (06/14/20 2356)  . vancomycin     PRN Meds:.diazepam, oxyCODONE-acetaminophen  HPI: Jacob Sanchez is a 54 y.o. male who has significant carpal tunnel syndrome in his right hand which is caused him to have numbness in all of his fingers and palm on the side.  He was apparently originally scheduled to have surgery to relieve his carpal tunnel syndrome but then found to have foot drop related to back pain and underwent an emergency laminectomy.  He had suffered an injury at work which did cause lower back pain and ultimately the pathology that led to his foot drop.  He ultimately however was convinced to no longer be employed with employer after workers compensation was worked agreed-upon  In the interim this August he had been grilling when he  burned his middle finger and developed a suture which opened further.  Ultimately he ended up developing osteomyelitis in his index finger adjacent to the area which she had noticed the burn.  He underwent a partial amputation by Dr. Roney Mans operative cultures yielding MSSA.  Treated with clindamycin preoperatively and then with Bactrim 1 tablet twice daily and then increased to 3 times daily by my partner Dr. Elinor Parkinson who followed him closely.  Did continue to have the wound on his middle finger.  He was seen by Dr Elinor Parkinson on 05/22/2020 and she felt he was doing well and she had him stop antibiotics.  In the interim he appearance worsening of wound on his middle finger for stopping the Bactrim.  He was seen by Dr. Roney Mans who obtained an MRI which showed evidence of osteomyelitis of the right middle finger.  He was then referred back to our office and seen yesterday by Dr. Elinor Parkinson and offered option of admission for IV abx vs trying to get PICC placed next week which was going to be well into Tuesday at earliest.  He has been started on vancomycin and ceftriaxone which I think is a reasonable regimen.  My own preference from an IV antibiotic standpoint would be for daptomycin and ceftriaxone but not clear the charity would cover this.  Another option would be to treat him with ceftriaxone and doxycycline.  I have reached out to Jeri Modena with advanced home care regarding the options  for what charity care might cover.  We will have insurance but it will not start until January.      Review of Systems: Review of Systems  Constitutional: Negative for chills, diaphoresis, fever, malaise/fatigue and weight loss.  HENT: Negative for congestion, hearing loss, sore throat and tinnitus.   Eyes: Negative for blurred vision and double vision.  Respiratory: Negative for cough, sputum production, shortness of breath and wheezing.   Cardiovascular: Negative for chest pain,  palpitations and leg swelling.  Gastrointestinal: Negative for abdominal pain, blood in stool, constipation, diarrhea, heartburn, melena, nausea and vomiting.  Genitourinary: Negative for dysuria, flank pain and hematuria.  Musculoskeletal: Positive for myalgias. Negative for back pain, falls and joint pain.  Skin: Negative for itching and rash.  Neurological: Negative for dizziness, sensory change, focal weakness, loss of consciousness, weakness and headaches.  Endo/Heme/Allergies: Does not bruise/bleed easily.  Psychiatric/Behavioral: Negative for depression, memory loss and suicidal ideas. The patient is not nervous/anxious.     Past Medical History:  Diagnosis Date  . Back pain    hx laminectomy  . Hypertension     Social History   Tobacco Use  . Smoking status: Never Smoker  . Smokeless tobacco: Current User    Types: Snuff  Vaping Use  . Vaping Use: Never used  Substance Use Topics  . Alcohol use: Yes    Comment: socially  . Drug use: Never    No family history on file. Allergies  Allergen Reactions  . Bee Venom Anaphylaxis  . Demerol [Meperidine] Anaphylaxis  . Codeine Rash    OBJECTIVE: Blood pressure (!) 134/93, pulse 64, temperature 97.7 F (36.5 C), temperature source Oral, resp. rate 16, height 6\' 3"  (1.905 m), weight 102.1 kg, SpO2 98 %.  Physical Exam Constitutional:      Appearance: He is well-developed and well-nourished.  HENT:     Head: Normocephalic and atraumatic.  Eyes:     Extraocular Movements: EOM normal.     Conjunctiva/sclera: Conjunctivae normal.  Cardiovascular:     Rate and Rhythm: Normal rate and regular rhythm.  Pulmonary:     Effort: Pulmonary effort is normal. No respiratory distress.     Breath sounds: No wheezing.  Abdominal:     General: There is no distension.     Palpations: Abdomen is soft.  Musculoskeletal:        General: No tenderness or edema. Normal range of motion.     Cervical back: Normal range of motion and  neck supple.  Skin:    General: Skin is warm and dry.     Coloration: Skin is not pale.     Findings: No erythema or rash.  Neurological:     General: No focal deficit present.     Mental Status: He is alert and oriented to person, place, and time.  Psychiatric:        Mood and Affect: Mood normal.        Behavior: Behavior normal.        Thought Content: Thought content normal.     Lab Results Lab Results  Component Value Date   WBC 7.4 06/15/2020   HGB 15.5 06/15/2020   HCT 43.2 06/15/2020   MCV 91.9 06/15/2020   PLT 206 06/15/2020    Lab Results  Component Value Date   CREATININE 0.95 06/15/2020   BUN 10 06/15/2020   NA 135 06/15/2020   K 3.6 06/15/2020   CL 104 06/15/2020   CO2 24 06/15/2020  Lab Results  Component Value Date   ALT 81 (H) 06/15/2020   AST 42 (H) 06/15/2020   ALKPHOS 51 06/15/2020   BILITOT 0.7 06/15/2020     Microbiology: Recent Results (from the past 240 hour(s))  Resp Panel by RT-PCR (Flu A&B, Covid) Nasopharyngeal Swab     Status: None   Collection Time: 06/14/20  1:22 PM   Specimen: Nasopharyngeal Swab; Nasopharyngeal(NP) swabs in vial transport medium  Result Value Ref Range Status   SARS Coronavirus 2 by RT PCR NEGATIVE NEGATIVE Final    Comment: (NOTE) SARS-CoV-2 target nucleic acids are NOT DETECTED.  The SARS-CoV-2 RNA is generally detectable in upper respiratory specimens during the acute phase of infection. The lowest concentration of SARS-CoV-2 viral copies this assay can detect is 138 copies/mL. A negative result does not preclude SARS-Cov-2 infection and should not be used as the sole basis for treatment or other patient management decisions. A negative result may occur with  improper specimen collection/handling, submission of specimen other than nasopharyngeal swab, presence of viral mutation(s) within the areas targeted by this assay, and inadequate number of viral copies(<138 copies/mL). A negative result must be  combined with clinical observations, patient history, and epidemiological information. The expected result is Negative.  Fact Sheet for Patients:  BloggerCourse.com  Fact Sheet for Healthcare Providers:  SeriousBroker.it  This test is no t yet approved or cleared by the [place] FDA and  has been authorized for detection and/or diagnosis of SARS-CoV-2 by FDA under an Emergency Use Authorization (EUA). This EUA will remain  in effect (meaning this test can be used) for the duration of the COVID-19 declaration under Section 564(b)(1) of the Act, 21 U.S.C.section 360bbb-3(b)(1), unless the authorization is terminated  or revoked sooner.       Influenza A by PCR NEGATIVE NEGATIVE Final   Influenza B by PCR NEGATIVE NEGATIVE Final    Comment: (NOTE) The Xpert Xpress SARS-CoV-2/FLU/RSV plus assay is intended as an aid in the diagnosis of influenza from Nasopharyngeal swab specimens and should not be used as a sole basis for treatment. Nasal washings and aspirates are unacceptable for Xpert Xpress SARS-CoV-2/FLU/RSV testing.  Fact Sheet for Patients: BloggerCourse.com  Fact Sheet for Healthcare Providers: SeriousBroker.it  This test is not yet approved or cleared by the [place] FDA and has been authorized for detection and/or diagnosis of SARS-CoV-2 by FDA under an Emergency Use Authorization (EUA). This EUA will remain in effect (meaning this test can be used) for the duration of the COVID-19 declaration under Section 564(b)(1) of the Act, 21 U.S.C. section 360bbb-3(b)(1), unless the authorization is terminated or revoked.  Performed at [facility], [address] [place], [place] [address]   Blood culture (routine x 2)     Status: None (Preliminary result)   Collection Time: 06/14/20  1:26 PM   Specimen: BLOOD  Result Value Ref Range Status   Specimen  Description BLOOD BLOOD LEFT FOREARM  Final   Special Requests   Final    BOTTLES DRAWN AEROBIC ONLY Blood Culture adequate volume Performed at [facility], [address], [place], [place] [address]    Culture PENDING  Incomplete   Report Status PENDING  Incomplete    Acey Lav, MD [facility] [phone] pager  06/15/2020, 12:24 PM

## 2020-06-15 NOTE — Progress Notes (Signed)
PROGRESS NOTE   Jacob Sanchez  IFO:[id number]    DOB: 1965-11-24    DOA: 06/14/2020  PCP: Virl Son., MD   I have briefly reviewed patients previous medical records in [facility].  Chief Complaint  Patient presents with  . Osteomyelitis     Brief Narrative:  54 year old male with history of hypertension, chronic back pain, right hand carpal tunnel release with related numbness in all his fingers and palm on the side, work injury related back pain/foot drop for which he underwent emergency laminectomy, osteomyelitis of right index finger, s/p partial amputation by Dr. Jeannie Fend and operative cultures grew MSSA, completed 6 weeks course of oral Bactrim until 11/24 when seen by ID and antibiotics were stopped, has had chronic right middle finger distal phalanx wound since August 2021, progressively got worse since stopping antibiotics more so in the last week with associated pain and swelling.  He was seen by Dr. Jeannie Fend and MRI showed osteomyelitis of right middle finger, referred back to ID and sent to the hospital for IV antibiotics, evaluation and management.  ID consulted, ceftriaxone and vancomycin continued for now.  Ortho input pending.   Assessment & Plan:  Active Problems:   Osteomyelitis (HCC)   Osteomyelitis of finger of right hand (HCC)   Osteomyelitis of right distal middle finger with wounds  Had chronic wound for several months.  Progressive worsening since stoppage of Bactrim 05/22/2020, more so in the last week.  Seen by Dr. Jeannie Fend as outpatient, MRI showed osteomyelitis, referred to ID and was sent to the hospital for admission and IV antibiotics.  Digit threatening infection given prior amputation of index finger.  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  Empirically started on IV ceftriaxone and vancomycin.  ID consulted, input appreciated, continue above for now but since patient does not have insurance at present,  they would prefer daptomycin and ceftriaxone and are exploring with home health company regarding this feasibility.  Per ID, discharge disposition may not be clarified until Monday 12/20.  CRP 0.6.  ESR 2.  Blood cultures pending.  PICC line to be placed.  Pain management.  Right index finger MSSA osteomyelitis, s/p partial amputation  Healed without acute issues now.  Mild transaminitis  Unclear etiology.  Better compared to admission.  No GI symptoms.  Essential hypertension  Mildly uncontrolled.  Continue atenolol 25 mg daily, lisinopril 10 mg daily and triamterene-HCTZ.  Could consider titrating up atenolol or lisinopril if still uncontrolled.  Chronic back pain  Pain management  Chronic anxiety  Continue home Valium as needed  ADHD  Adderall on hold for now  Body mass index is 28.12 kg/m.    DVT prophylaxis: enoxaparin (LOVENOX) injection 40 mg Start: 06/15/20 1000     Code Status: Full Code Family Communication: None at bedside Disposition:  Status is: Observation  The patient will require care spanning > 2 midnights and should be moved to inpatient because: IV treatments appropriate due to intensity of illness or inability to take PO and Inpatient level of care appropriate due to severity of illness  Dispo:  Patient From: Home  Planned Disposition: Home with Health Care Svc  Expected discharge date: 06/17/2020  Medically stable for discharge: No    Consultants:   Infectious disease Orthopedics  Procedures:   None  Antimicrobials:    Anti-infectives (From admission, onward)   Start     Dose/Rate Route Frequency Ordered Stop   06/15/20 1200  vancomycin (VANCOREADY) IVPB 1500  mg/300 mL       "Followed by" Linked Group Details   1,500 mg 150 mL/hr over 120 Minutes Intravenous Every 12 hours 06/14/20 2259     06/14/20 2300  vancomycin (VANCOREADY) IVPB 2000 mg/400 mL       "Followed by" Linked Group Details   2,000 mg 200 mL/hr over 120  Minutes Intravenous  Once 06/14/20 2259 06/15/20 0204   06/14/20 2245  cefTRIAXone (ROCEPHIN) 2 g in sodium chloride 0.9 % 100 mL IVPB        2 g 200 mL/hr over 30 Minutes Intravenous Every 24 hours 06/14/20 2244          Subjective:  History as noted above.  Reports intermittent pain of right middle finger, worse at nights.  Objective:   Vitals:   06/14/20 2330 06/15/20 0015 06/15/20 0238 06/15/20 0738  BP: (!) 150/110 (!) 162/105 (!) 156/87 (!) 134/93  Pulse: (!) 54 75 80 64  Resp: (!) _0 Temp:   97.6 F (36.4 C) 97.7 F (36.5 C)  TempSrc:   Oral Oral  SpO2: 98% 99% 98% 98%  Weight:      Height:        General exam: Pleasant young male, moderately built and nourished ambulating comfortably in the room without distress. Respiratory system: Clear to auscultation. Respiratory effort normal. Cardiovascular system: S1 & S2 heard, RRR. No JVD, murmurs, rubs, gallops or clicks. No pedal edema. Gastrointestinal system: Abdomen is nondistended, soft and nontender. No organomegaly or masses felt. Normal bowel sounds heard. Central nervous system: Alert and oriented. No focal neurological deficits. Extremities: Symmetric 5 x 5 power.  Right distal middle finger with superficial chronic wounds, mild swelling, definitive tenderness but no fluctuation or crepitus.  See picture below from admission. Skin: No rashes, lesions or ulcers Psychiatry: Judgement and insight appear normal. Mood & affect appropriate.       Data Reviewed:   I have personally reviewed following labs and imaging studies   CBC: Recent Labs  Lab 06/14/20 1326 06/15/20 0347  WBC 7.2 7.4  NEUTROABS 4.4  --   HGB 16.1 15.5  HCT 48.2 43.2  MCV 94.1 91.9  PLT 234 782    Basic Metabolic Panel: Recent Labs  Lab 06/14/20 1326 06/15/20 0347  NA 137 135  K 3.9 3.6  CL 104 104  CO2 24 24  GLUCOSE 117* 117*  BUN 6 10  CREATININE 0.94 0.95  CALCIUM 10.5* 10.1    Liver Function Tests: Recent  Labs  Lab 06/14/20 1326 06/15/20 0347  AST 63* 42*  ALT 107* 81*  ALKPHOS 57 51  BILITOT 0.8 0.7  PROT 6.9 6.2*  ALBUMIN 3.8 3.4*    CBG: No results for input(s): GLUCAP in the last 168 hours.  Microbiology Studies:   Recent Results (from the past 240 hour(s))  Resp Panel by RT-PCR (Flu A&B, Covid) Nasopharyngeal Swab     Status: None   Collection Time: 06/14/20  1:22 PM   Specimen: Nasopharyngeal Swab; Nasopharyngeal(NP) swabs in vial transport medium  Result Value Ref Range Status   SARS Coronavirus 2 by RT PCR NEGATIVE NEGATIVE Final    Comment: (NOTE) SARS-CoV-2 target nucleic acids are NOT DETECTED.  The SARS-CoV-2 RNA is generally detectable in upper respiratory specimens during the acute phase of infection. The lowest concentration of SARS-CoV-2 viral copies this assay can detect is 138 copies/mL. A negative result does not preclude SARS-Cov-2 infection and should not be used as  the sole basis for treatment or other patient management decisions. A negative result may occur with  improper specimen collection/handling, submission of specimen other than nasopharyngeal swab, presence of viral mutation(s) within the areas targeted by this assay, and inadequate number of viral copies(<138 copies/mL). A negative result must be combined with clinical observations, patient history, and epidemiological information. The expected result is Negative.  Fact Sheet for Patients:  EntrepreneurPulse.com.au  Fact Sheet for Healthcare Providers:  IncredibleEmployment.be  This test is no t yet approved or cleared by the [place] FDA and  has been authorized for detection and/or diagnosis of SARS-CoV-2 by FDA under an Emergency Use Authorization (EUA). This EUA will remain  in effect (meaning this test can be used) for the duration of the COVID-19 declaration under Section 564(b)(1) of the Act, 21 U.S.C.section 360bbb-3(b)(1), unless the  authorization is terminated  or revoked sooner.       Influenza A by PCR NEGATIVE NEGATIVE Final   Influenza B by PCR NEGATIVE NEGATIVE Final    Comment: (NOTE) The Xpert Xpress SARS-CoV-2/FLU/RSV plus assay is intended as an aid in the diagnosis of influenza from Nasopharyngeal swab specimens and should not be used as a sole basis for treatment. Nasal washings and aspirates are unacceptable for Xpert Xpress SARS-CoV-2/FLU/RSV testing.  Fact Sheet for Patients: EntrepreneurPulse.com.au  Fact Sheet for Healthcare Providers: IncredibleEmployment.be  This test is not yet approved or cleared by the [place] FDA and has been authorized for detection and/or diagnosis of SARS-CoV-2 by FDA under an Emergency Use Authorization (EUA). This EUA will remain in effect (meaning this test can be used) for the duration of the COVID-19 declaration under Section 564(b)(1) of the Act, 21 U.S.C. section 360bbb-3(b)(1), unless the authorization is terminated or revoked.  Performed at [facility], [place] [address], [place], [place] [address]   Blood culture (routine x 2)     Status: None (Preliminary result)   Collection Time: 06/14/20  1:26 PM   Specimen: BLOOD  Result Value Ref Range Status   Specimen Description BLOOD BLOOD LEFT FOREARM  Final   Special Requests   Final    BOTTLES DRAWN AEROBIC ONLY Blood Culture adequate volume Performed at [facility], [place] [address], [place], [place] [address]    Culture PENDING  Incomplete   Report Status PENDING  Incomplete     Radiology Studies:  [place] EKG SITE RITE  Result Date: 06/15/2020 If Site Rite image not attached, placement could not be confirmed due to current cardiac rhythm.    Scheduled Meds:   . atenolol  25 mg Oral Daily  . enoxaparin (LOVENOX) injection  40 mg Subcutaneous Daily  . lisinopril  10 mg Oral Daily  . multivitamin with minerals  1 tablet Oral Daily  .  triamterene-hydrochlorothiazide  1 tablet Oral Daily    Continuous Infusions:   . cefTRIAXone (ROCEPHIN)  IV Stopped (06/14/20 2356)  . vancomycin 1,500 mg (06/15/20 1228)     LOS: 0 days     Vernell Leep, MD, [employer], [facility]. [facility]    To contact the attending provider between 7A-7P or the covering provider during after hours 7P-7A, please log into the web site [email or web address] and access using universal [place] password for that web site. If you do not have the password, please call the hospital operator.  06/15/2020, 2:46 PM
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
## 2020-06-15 NOTE — Progress Notes (Signed)
Peripherally Inserted Central Catheter Placement  The IV Nurse has discussed with the patient and/or persons authorized to consent for the patient, the purpose of this procedure and the potential benefits and risks involved with this procedure.  The benefits include less needle sticks, lab draws from the catheter, and the patient may be discharged home with the catheter. Risks include, but not limited to, infection, bleeding, blood clot (thrombus formation), and puncture of an artery; nerve damage and irregular heartbeat and possibility to perform a PICC exchange if needed/ordered by physician.  Alternatives to this procedure were also discussed.  Bard Power PICC patient education guide, fact sheet on infection prevention and patient information card has been provided to patient /or left at bedside.    PICC Placement Documentation  PICC Single Lumen 06/15/20 PICC Right Cephalic 41 cm 0 cm (Active)  Indication for Insertion or Continuance of Line Home intravenous therapies (PICC only) 06/15/20 1904  Exposed Catheter (cm) 0 cm 06/15/20 1904  Site Assessment Clean;Dry;Intact 06/15/20 1904  Line Status Flushed;Saline locked;Blood return noted 06/15/20 1904  Dressing Type Transparent 06/15/20 1904  Dressing Status Clean;Dry;Intact 06/15/20 1904  Antimicrobial disc in place? Yes 06/15/20 1904  Safety Lock Not Applicable 06/15/20 1904  Line Care Connections checked and tightened 06/15/20 1904  Line Adjustment (NICU/IV Team Only) No 06/15/20 1904  Dressing Intervention New dressing 06/15/20 1904  Dressing Change Due 06/22/20 06/15/20 1904       Elliot Dally 06/15/2020, 7:07 PM

## 2020-06-15 NOTE — Plan of Care (Signed)
  Problem: Education: Goal: Knowledge of General Education information will improve Description: Including pain rating scale, medication(s)/side effects and non-pharmacologic comfort measures Outcome: Progressing   Problem: Clinical Measurements: Goal: Ability to maintain clinical measurements within normal limits will improve Outcome: Progressing Goal: Will remain free from infection Outcome: Progressing   Problem: Nutrition: Goal: Adequate nutrition will be maintained Outcome: Progressing   Problem: Coping: Goal: Level of anxiety will decrease Outcome: Progressing   

## 2020-06-16 LAB — COMPREHENSIVE METABOLIC PANEL
ALT: 99 U/L — ABNORMAL HIGH (ref 0–44)
AST: 52 U/L — ABNORMAL HIGH (ref 15–41)
Albumin: 3.4 g/dL — ABNORMAL LOW (ref 3.5–5.0)
Alkaline Phosphatase: 52 U/L (ref 38–126)
Anion gap: 10 (ref 5–15)
BUN: 11 mg/dL (ref 6–20)
CO2: 24 mmol/L (ref 22–32)
Calcium: 10.5 mg/dL — ABNORMAL HIGH (ref 8.9–10.3)
Chloride: 101 mmol/L (ref 98–111)
Creatinine, Ser: 1 mg/dL (ref 0.61–1.24)
GFR, Estimated: >60 mL/min (ref >60)
Glucose, Bld: 119 mg/dL — ABNORMAL HIGH (ref 70–99)
Potassium: 4 mmol/L (ref 3.5–5.1)
Sodium: 135 mmol/L (ref 135–145)
Total Bilirubin: 0.5 mg/dL (ref 0.3–1.2)
Total Protein: 6.6 g/dL (ref 6.5–8.1)

## 2020-06-16 LAB — CBC
HCT: 46.2 % (ref 39.0–52.0)
Hemoglobin: 16.5 g/dL (ref 13.0–17.0)
MCH: 32.7 pg (ref 26.0–34.0)
MCHC: 35.7 g/dL (ref 30.0–36.0)
MCV: 91.7 fL (ref 80.0–100.0)
Platelets: 210 10*3/uL (ref 150–400)
RBC: 5.04 MIL/uL (ref 4.22–5.81)
RDW: 13.2 % (ref 11.5–15.5)
WBC: 6.9 10*3/uL (ref 4.0–10.5)
nRBC: 0 % (ref 0.0–0.2)

## 2020-06-16 LAB — HEPATITIS B SURFACE ANTIGEN: Hepatitis B Surface Ag: NONREACTIVE

## 2020-06-16 LAB — HEPATITIS C ANTIBODY: HCV Ab: NONREACTIVE

## 2020-06-16 NOTE — TOC Initial Note (Signed)
Transition of Care [facility]) - Initial/Assessment Note    Patient Details  Name: Jacob Sanchez MRN: [id number] Date of Birth: 02-01-1966  [facility] of [facility]) CM/SW Contact:    Lawerance Sabal, RN Phone Number: 06/16/2020, 4:07 PM  Clinical Narrative:                Patient with potential to DC to home w PICC and IV Abx, rocephin and vanc, for 6 weeks. Final determination of treatment will be determined on Monday between patient Dr Elinor Parkinson, per latest ID note. Pam w [facility] Rice is following for potential charity vs out of pocket set up, and will meet w patient tomorrow. TOC will follow for potential LOG for [facility] RN and assistance for any other Dc needs.    Expected Discharge Plan: Home w Home Health Services Barriers to Discharge: Inadequate or no insurance,Continued Medical Work up   Patient Goals and CMS Choice     Choice offered to / list presented to : NA  Expected Discharge Plan and Services Expected Discharge Plan: Home w Home Health Services   Discharge Planning Services: CM Consult,Medication Assistance Post Acute Care Choice: Durable Medical Equipment,Home Health                                        Prior Living Arrangements/Services                       Activities of Daily Living Home Assistive Devices/Equipment: None ADL Screening (condition at time of admission) Patient's cognitive ability adequate to safely complete daily activities?: Yes Is the patient deaf or have difficulty hearing?: No Does the patient have difficulty seeing, even when wearing glasses/contacts?: No Does the patient have difficulty concentrating, remembering, or making decisions?: No Patient able to express need for assistance with ADLs?: Yes Does the patient have difficulty dressing or bathing?: No Independently performs ADLs?: Yes (appropriate for developmental age) Does the patient have difficulty walking or climbing stairs?: No Weakness of Legs: None Weakness of  Arms/Hands: None  Permission Sought/Granted                  Emotional Assessment              Admission diagnosis:  Osteomyelitis (HCC) [M86.9] Osteomyelitis of finger of right hand (HCC) [M86.9] Patient Active Problem List   Diagnosis Date Noted  . Osteomyelitis of finger of right hand (HCC) 06/15/2020  . Wound, open 05/22/2020  . Osteomyelitis (HCC) 05/22/2020  . Medication monitoring encounter 05/22/2020  . Immunization due 05/22/2020  . Osteomyelitis of right hand (HCC) 03/29/2020  . 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 Biceps tendon tear 07/06/2016  . DDD (degenerative disc disease), lumbar 05/29/2016  . Chronic bilateral low back pain 04/27/2016  . Insomnia due to medical condition 04/27/2016   PCP:  Ignacia Palma., MD Pharmacy:   [facility] [address] [place] - [address] [place] [address] Phone: [phone] Fax: [phone]     Social Determinants of Health (SDOH) Interventions    Readmission Risk Interventions No flowsheet data found.

## 2020-06-16 NOTE — Progress Notes (Signed)
Subjective: No new complaints   Antibiotics:  Anti-infectives (From admission, onward)   Start     Dose/Rate Route Frequency Ordered Stop   06/15/20 1200  vancomycin (VANCOREADY) IVPB 1500 mg/300 mL       "Followed by" Linked Group Details   1,500 mg 150 mL/hr over 120 Minutes Intravenous Every 12 hours 06/14/20 2259     06/14/20 2300  vancomycin (VANCOREADY) IVPB 2000 mg/400 mL       "Followed by" Linked Group Details   2,000 mg 200 mL/hr over 120 Minutes Intravenous  Once 06/14/20 2259 06/15/20 0204   06/14/20 2245  cefTRIAXone (ROCEPHIN) 2 g in sodium chloride 0.9 % 100 mL IVPB        2 g 200 mL/hr over 30 Minutes Intravenous Every 24 hours 06/14/20 2244        Medications: Scheduled Meds: . atenolol  25 mg Oral Daily  . Chlorhexidine Gluconate Cloth  6 each Topical Daily  . enoxaparin (LOVENOX) injection  40 mg Subcutaneous Daily  . lisinopril  10 mg Oral Daily  . multivitamin with minerals  1 tablet Oral Daily  . sodium chloride flush  10-40 mL Intracatheter Q12H  . triamterene-hydrochlorothiazide  1 tablet Oral Daily   Continuous Infusions: . cefTRIAXone (ROCEPHIN)  IV 2 g (06/15/20 2116)  . vancomycin 1,500 mg (06/15/20 2334)   PRN Meds:.diazepam, oxyCODONE-acetaminophen, sodium chloride flush    Objective: Weight change:   Intake/Output Summary (Last 24 hours) at 06/16/2020 1310 Last data filed at 06/16/2020 0900 Gross per 24 hour  Intake 1575.98 ml  Output --  Net 1575.98 ml   Blood pressure 108/77, pulse 73, temperature 97.9 F (36.6 C), temperature source Oral, resp. rate 16, height 6\' 3"  (1.905 m), weight 102.1 kg, SpO2 94 %. Temp:  [97.9 F (36.6 C)-99.1 F (37.3 C)] 97.9 F (36.6 C) (12/19 0750) Pulse Rate:  [51-73] 73 (12/19 1015) Resp:  [16-17] 16 (12/19 0750) BP: (93-129)/(65-87) 108/77 (12/19 1015) SpO2:  [91 %-99 %] 94 % (12/19 0750)  Physical Exam: Physical Exam Constitutional:      Appearance: He is well-developed and  well-nourished.  HENT:     Head: Normocephalic and atraumatic.  Eyes:     Extraocular Movements: EOM normal.     Conjunctiva/sclera: Conjunctivae normal.  Cardiovascular:     Rate and Rhythm: Normal rate and regular rhythm.  Pulmonary:     Effort: Pulmonary effort is normal. No respiratory distress.     Breath sounds: No wheezing.  Abdominal:     General: There is no distension.     Palpations: Abdomen is soft.  Musculoskeletal:        General: No edema. Normal range of motion.     Cervical back: Normal range of motion and neck supple.  Skin:    General: Skin is warm and dry.     Findings: No erythema or rash.  Neurological:     General: No focal deficit present.     Mental Status: He is alert and oriented to person, place, and time.  Psychiatric:        Mood and Affect: Mood and affect and mood normal.        Behavior: Behavior normal.        Thought Content: Thought content normal.        Judgment: Judgment normal.     Finger as photographed yesterday      CBC:    BMET Recent Labs  06/15/20 0347 06/16/20 0214  NA 135 135  K 3.6 4.0  CL 104 101  CO2 24 24  GLUCOSE 117* 119*  BUN 10 11  CREATININE 0.95 1.00  CALCIUM 10.1 10.5*     Liver Panel  Recent Labs    06/15/20 0347 06/16/20 0214  PROT 6.2* 6.6  ALBUMIN 3.4* 3.4*  AST 42* 52*  ALT 81* 99*  ALKPHOS 51 52  BILITOT 0.7 0.5       Sedimentation Rate Recent Labs    06/14/20 2314  ESRSEDRATE 2   C-Reactive Protein Recent Labs    06/14/20 2314  CRP 0.6    Micro Results: Recent Results (from the past 720 hour(s))  Resp Panel by RT-PCR (Flu A&B, Covid) Nasopharyngeal Swab     Status: None   Collection Time: 06/14/20  1:22 PM   Specimen: Nasopharyngeal Swab; Nasopharyngeal(NP) swabs in vial transport medium  Result Value Ref Range Status   SARS Coronavirus 2 by RT PCR NEGATIVE NEGATIVE Final    Comment: (NOTE) SARS-CoV-2 target nucleic acids are NOT DETECTED.  The SARS-CoV-2  RNA is generally detectable in upper respiratory specimens during the acute phase of infection. The lowest concentration of SARS-CoV-2 viral copies this assay can detect is 138 copies/mL. A negative result does not preclude SARS-Cov-2 infection and should not be used as the sole basis for treatment or other patient management decisions. A negative result may occur with  improper specimen collection/handling, submission of specimen other than nasopharyngeal swab, presence of viral mutation(s) within the areas targeted by this assay, and inadequate number of viral copies(<138 copies/mL). A negative result must be combined with clinical observations, patient history, and epidemiological information. The expected result is Negative.  Fact Sheet for Patients:  BloggerCourse.com  Fact Sheet for Healthcare Providers:  SeriousBroker.it  This test is no t yet approved or cleared by the [place] FDA and  has been authorized for detection and/or diagnosis of SARS-CoV-2 by FDA under an Emergency Use Authorization (EUA). This EUA will remain  in effect (meaning this test can be used) for the duration of the COVID-19 declaration under Section 564(b)(1) of the Act, 21 U.S.C.section 360bbb-3(b)(1), unless the authorization is terminated  or revoked sooner.       Influenza A by PCR NEGATIVE NEGATIVE Final   Influenza B by PCR NEGATIVE NEGATIVE Final    Comment: (NOTE) The Xpert Xpress SARS-CoV-2/FLU/RSV plus assay is intended as an aid in the diagnosis of influenza from Nasopharyngeal swab specimens and should not be used as a sole basis for treatment. Nasal washings and aspirates are unacceptable for Xpert Xpress SARS-CoV-2/FLU/RSV testing.  Fact Sheet for Patients: BloggerCourse.com  Fact Sheet for Healthcare Providers: SeriousBroker.it  This test is not yet approved or cleared by the  [place] FDA and has been authorized for detection and/or diagnosis of SARS-CoV-2 by FDA under an Emergency Use Authorization (EUA). This EUA will remain in effect (meaning this test can be used) for the duration of the COVID-19 declaration under Section 564(b)(1) of the Act, 21 U.S.C. section 360bbb-3(b)(1), unless the authorization is terminated or revoked.  Performed at [facility], [address], [place], [place] [address]   Blood culture (routine x 2)     Status: None (Preliminary result)   Collection Time: 06/14/20  1:26 PM   Specimen: BLOOD  Result Value Ref Range Status   Specimen Description BLOOD BLOOD LEFT FOREARM  Final   Special Requests   Final    BOTTLES  DRAWN AEROBIC ONLY Blood Culture adequate volume   Culture   Final    NO GROWTH 2 DAYS Performed at [facility], [address] [place], [place] [address]    Report Status PENDING  Incomplete  Blood culture (routine x 2)     Status: None (Preliminary result)   Collection Time: 06/14/20  1:41 PM   Specimen: BLOOD  Result Value Ref Range Status   Specimen Description BLOOD LEFT ANTECUBITAL  Final   Special Requests   Final    BOTTLES DRAWN AEROBIC AND ANAEROBIC Blood Culture adequate volume   Culture   Final    NO GROWTH 2 DAYS Performed at [facility], [address] [place], [place] [address]    Report Status PENDING  Incomplete    Studies/Results: [place] EKG SITE RITE  Result Date: 06/15/2020 If Site Rite image not attached, placement could not be confirmed due to current cardiac rhythm.     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 male with with fairly significant numbness from carpal tunnel syndrome who sustained a burn to his middle finger (and likely also to his index finger) went on to develop osteomyelitis of his index finger which  required amputation in which yielded a MSSA species on operative culture now with osteomyelitis of his middle finger.  Unfortunately his prior Worker's Compensation case led to him no longer being employed by a company not having insurance and he is not getting new insurance in place until the new year.  He is currently on vancomycin and ceftriaxone I have reached out to Jeri Modena from [facility] with regards to charity care in this case.  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  He also has concerns about the chances of IV antibiotics succeeding  For now plan is for IV vancomycin and ceftriaxone x 6 weeks but I will let him make final decisions tomorrow with Dr Elinor Parkinson.   LOS: 1 day   Jacob Sanchez 06/16/2020, 1:10 PM

## 2020-06-16 NOTE — Plan of Care (Signed)
Patient is alert and oriented x4. Received pain medication once this shift. On IV Meropenem and Vancomycin through Right arm PICC. Dry dressing applied to 2nd right finger per patient request, he stated that site was bleeding, site noted to be dry upon assessment.  Problem: Clinical Measurements: Goal: Ability to maintain clinical measurements within normal limits will improve Outcome: Progressing Goal: Will remain free from infection Outcome: Progressing Goal: Diagnostic test results will improve Outcome: Progressing Goal: Respiratory complications will improve Outcome: Progressing Goal: Cardiovascular complication will be avoided Outcome: Progressing   Problem: Activity: Goal: Risk for activity intolerance will decrease Outcome: Progressing   Problem: Nutrition: Goal: Adequate nutrition will be maintained Outcome: Progressing   Problem: Coping: Goal: Level of anxiety will decrease Outcome: Progressing   Problem: Elimination: Goal: Will not experience complications related to bowel motility Outcome: Progressing Goal: Will not experience complications related to urinary retention Outcome: Progressing   Problem: Pain Managment: Goal: General experience of comfort will improve Outcome: Progressing   Problem: Safety: Goal: Ability to remain free from injury will improve Outcome: Progressing   Problem: Skin Integrity: Goal: Risk for impaired skin integrity will decrease Outcome: Progressing

## 2020-06-16 NOTE — Progress Notes (Signed)
PROGRESS NOTE   Jacob Sanchez  MRN:[id number]    DOB: 02/01/1966    DOA: 06/14/2020  PCP: Beck, Mark C., MD   I have briefly reviewed patients previous medical records in [facility].  Chief Complaint  Patient presents with  . Osteomyelitis     Brief Narrative:  54-year-old male with history of hypertension, chronic back pain, right hand carpal tunnel release with related numbness in all his fingers and palm on the side, work injury related back pain/foot drop for which he underwent emergency laminectomy, osteomyelitis of right index finger, s/p partial amputation by Dr. Creighton and operative cultures grew MSSA, completed 6 weeks course of oral Bactrim until 11/24 when seen by ID and antibiotics were stopped, has had chronic right middle finger distal phalanx wound since August 2021, progressively got worse since stopping antibiotics more so in the last week with associated pain and swelling.  He was seen by Dr. Creighton and MRI showed osteomyelitis of right middle finger, referred back to ID and sent to the hospital for IV antibiotics, evaluation and management.  ID consulted, ceftriaxone and vancomycin continued for now.  Orthopedics aware from EDP on admission but have not seen, do not think there is much for them to do at this time.  Right upper arm PICC line placed 12/18.  Since patient does not have insurance, currently waiting to see what antibiotic can be arranged for outpatient setting and the earliest this can be done will be on 12/20.   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 male with with fairly significant numbness from carpal tunnel syndrome who sustained a burn to his middle finger (and likely also to his index finger) went on to develop osteomyelitis of his index finger which required amputation in  which yielded a MSSA species on operative culture now with osteomyelitis of his middle finger."  Had chronic wound for several months.  Progressive worsening since stoppage of Bactrim 05/22/2020, more so in the last week.  Seen by Dr. Creighton as outpatient, MRI showed osteomyelitis, referred to ID and was sent to the hospital for admission and IV antibiotics.  Digit threatening infection given prior amputation of index finger.  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  Empirically started on IV ceftriaxone and vancomycin.  ID consulted, input appreciated, continue above for now but since patient does not have insurance at present, they would prefer daptomycin and ceftriaxone and are exploring with home health company regarding this feasibility.  Per ID, discharge disposition may not be clarified until Monday 12/20.  CRP 0.6.  ESR 2.  Blood cultures pending.  RUE line placed 12/18.  Pain management.  Stable.  Right index finger MSSA osteomyelitis, s/p partial amputation  Healed without acute issues now.  Mild transaminitis  Unclear etiology.  Stable.  No GI symptoms.  Essential hypertension  Soft blood pressures at times.  Continue atenolol 25 mg daily, lisinopril 10 mg daily and triamterene-HCTZ.  This is his home regimen.  Patient feels that his blood pressures are lower here because he is on a low-salt diet.  Discussed with him regarding holding or reducing some of his antihypertensives and changing to regular diet.  Patient wishes to watch for a little longer prior to making any further decisions.  Chronic back pain  Pain management    Chronic anxiety  Continue home Valium as needed  ADHD  Adderall on hold for now  Body mass index is 28.12 kg/m.    DVT prophylaxis: enoxaparin (LOVENOX) injection 40 mg Start: 06/15/20 1000     Code Status: Full Code Family Communication: None at bedside Disposition:  Status is: Observation  The  patient will require care spanning > 2 midnights and should be moved to inpatient because: IV treatments appropriate due to intensity of illness or inability to take PO and Inpatient level of care appropriate due to severity of illness  Dispo:  Patient From: Home  Planned Disposition: Home with Health Care Svc  Expected discharge date: 06/17/2020  Medically stable for discharge: No    Consultants:   Infectious disease Orthopedics  Procedures:   None  Antimicrobials:    Anti-infectives (From admission, onward)   Start     Dose/Rate Route Frequency Ordered Stop   06/15/20 1200  vancomycin (VANCOREADY) IVPB 1500 mg/300 mL       "Followed by" Linked Group Details   1,500 mg 150 mL/hr over 120 Minutes Intravenous Every 12 hours 06/14/20 2259     06/14/20 2300  vancomycin (VANCOREADY) IVPB 2000 mg/400 mL       "Followed by" Linked Group Details   2,000 mg 200 mL/hr over 120 Minutes Intravenous  Once 06/14/20 2259 06/15/20 0204   06/14/20 2245  cefTRIAXone (ROCEPHIN) 2 g in sodium chloride 0.9 % 100 mL IVPB        2 g 200 mL/hr over 30 Minutes Intravenous Every 24 hours 06/14/20 2244          Subjective:  Reports feeling somewhat weak generally this morning in the context of SBP in the 90s.  No dizziness, lightheadedness, feeling like passing out, chest pain or dyspnea.  He feels that this may be due to the low-salt diet and his antihypertensives.  Objective:   Vitals:   06/15/20 2017 06/16/20 0500 06/16/20 0750 06/16/20 1015  BP: 126/82 93/65 99/70 108/77  Pulse: 72 (!) 51 61 73  Resp: 16 17 16   Temp: 99.1 F (37.3 C) 98 F (36.7 C) 97.9 F (36.6 C)   TempSrc: Oral Oral Oral   SpO2: 97% 91% 94%   Weight:      Height:        General exam: Pleasant young male, moderately built and nourished lying comfortably propped up in bed without distress. Respiratory system: Clear to auscultation. Respiratory effort normal. Cardiovascular system: S1 & S2 heard, RRR. No JVD,  murmurs, rubs, gallops or clicks. No pedal edema. Gastrointestinal system: Abdomen is nondistended, soft and nontender. No organomegaly or masses felt. Normal bowel sounds heard. Central nervous system: Alert and oriented. No focal neurological deficits. Extremities: Symmetric 5 x 5 power.  Right distal middle finger with superficial chronic wounds, mild swelling, definitive tenderness but no fluctuation or crepitus.  See picture below from admission.  No significant change compared to yesterday's exam Skin: No rashes, lesions or ulcers Psychiatry: Judgement and insight appear normal. Mood & affect appropriate.       Data Reviewed:   I have personally reviewed following labs and imaging studies   CBC: Recent Labs  Lab 06/14/20 1326 06/15/20 0347 06/16/20 0214  WBC 7.2 7.4 6.9  NEUTROABS 4.4  --   --   HGB 16.1 15.5 16.5  HCT 48.2 43.2 46.2  MCV 94.1 91.9 91.7  PLT 234 206 210    Basic Metabolic Panel: Recent Labs  Lab 06/14/20   1326 06/15/20 0347 06/16/20 0214  NA 137 135 135  K 3.9 3.6 4.0  CL 104 104 101  CO2 24 24 24  GLUCOSE 117* 117* 119*  BUN 6 10 11  CREATININE 0.94 0.95 1.00  CALCIUM 10.5* 10.1 10.5*    Liver Function Tests: Recent Labs  Lab 06/14/20 1326 06/15/20 0347 06/16/20 0214  AST 63* 42* 52*  ALT 107* 81* 99*  ALKPHOS 57 51 52  BILITOT 0.8 0.7 0.5  PROT 6.9 6.2* 6.6  ALBUMIN 3.8 3.4* 3.4*    CBG: No results for input(s): GLUCAP in the last 168 hours.  Microbiology Studies:   Recent Results (from the past 240 hour(s))  Resp Panel by RT-PCR (Flu A&B, Covid) Nasopharyngeal Swab     Status: None   Collection Time: 06/14/20  1:22 PM   Specimen: Nasopharyngeal Swab; Nasopharyngeal(NP) swabs in vial transport medium  Result Value Ref Range Status   SARS Coronavirus 2 by RT PCR NEGATIVE NEGATIVE Final    Comment: (NOTE) SARS-CoV-2 target nucleic acids are NOT DETECTED.  The SARS-CoV-2 RNA is generally detectable in upper  respiratory specimens during the acute phase of infection. The lowest concentration of SARS-CoV-2 viral copies this assay can detect is 138 copies/mL. A negative result does not preclude SARS-Cov-2 infection and should not be used as the sole basis for treatment or other patient management decisions. A negative result may occur with  improper specimen collection/handling, submission of specimen other than nasopharyngeal swab, presence of viral mutation(s) within the areas targeted by this assay, and inadequate number of viral copies(<138 copies/mL). A negative result must be combined with clinical observations, patient history, and epidemiological information. The expected result is Negative.  Fact Sheet for Patients:  https://www.fda.gov/media/152166/download  Fact Sheet for Healthcare Providers:  https://www.fda.gov/media/152162/download  This test is no t yet approved or cleared by the [place] FDA and  has been authorized for detection and/or diagnosis of SARS-CoV-2 by FDA under an Emergency Use Authorization (EUA). This EUA will remain  in effect (meaning this test can be used) for the duration of the COVID-19 declaration under Section 564(b)(1) of the Act, 21 U.S.C.section 360bbb-3(b)(1), unless the authorization is terminated  or revoked sooner.       Influenza A by PCR NEGATIVE NEGATIVE Final   Influenza B by PCR NEGATIVE NEGATIVE Final    Comment: (NOTE) The Xpert Xpress SARS-CoV-2/FLU/RSV plus assay is intended as an aid in the diagnosis of influenza from Nasopharyngeal swab specimens and should not be used as a sole basis for treatment. Nasal washings and aspirates are unacceptable for Xpert Xpress SARS-CoV-2/FLU/RSV testing.  Fact Sheet for Patients: https://www.fda.gov/media/152166/download  Fact Sheet for Healthcare Providers: https://www.fda.gov/media/152162/download  This test is not yet approved or cleared by the [place] FDA and has been  authorized for detection and/or diagnosis of SARS-CoV-2 by FDA under an Emergency Use Authorization (EUA). This EUA will remain in effect (meaning this test can be used) for the duration of the COVID-19 declaration under Section 564(b)(1) of the Act, 21 U.S.C. section 360bbb-3(b)(1), unless the authorization is terminated or revoked.  Performed at [facility], [address] [place], [place] [address]   Blood culture (routine x 2)     Status: None (Preliminary result)   Collection Time: 06/14/20  1:26 PM   Specimen: BLOOD  Result Value Ref Range Status   Specimen Description BLOOD BLOOD LEFT FOREARM  Final   Special Requests   Final    BOTTLES DRAWN AEROBIC ONLY   Blood Culture adequate volume   Culture   Final    NO GROWTH 2 DAYS Performed at [facility], [address] [place], [place] [address]    Report Status PENDING  Incomplete  Blood culture (routine x 2)     Status: None (Preliminary result)   Collection Time: 06/14/20  1:41 PM   Specimen: BLOOD  Result Value Ref Range Status   Specimen Description BLOOD LEFT ANTECUBITAL  Final   Special Requests   Final    BOTTLES DRAWN AEROBIC AND ANAEROBIC Blood Culture adequate volume   Culture   Final    NO GROWTH 2 DAYS Performed at [facility], [address] [place], [place] [address]    Report Status PENDING  Incomplete     Radiology Studies:  US EKG SITE RITE  Result Date: 06/15/2020 If Site Rite image not attached, placement could not be confirmed due to current cardiac rhythm.    Scheduled Meds:   . atenolol  25 mg Oral Daily  . Chlorhexidine Gluconate Cloth  6 each Topical Daily  . enoxaparin (LOVENOX) injection  40 mg Subcutaneous Daily  . lisinopril  10 mg Oral Daily  . multivitamin with minerals  1 tablet Oral Daily  . sodium chloride flush  10-40 mL Intracatheter Q12H  . triamterene-hydrochlorothiazide  1 tablet Oral Daily    Continuous Infusions:   . cefTRIAXone (ROCEPHIN)  IV 2  g (06/15/20 2116)  . vancomycin 1,500 mg (06/16/20 1317)     LOS: 1 day     Anand Hongalgi, MD, FACP, SFHM. [facility]    To contact the attending provider between 7A-7P or the covering provider during after hours 7P-7A, please log into the web site [email or web address] and access using universal [place] password for that web site. If you do not have the password, please call the hospital operator.  06/16/2020, 1:25 PM   

## 2020-06-17 DIAGNOSIS — I10 Essential (primary) hypertension: Secondary | ICD-10-CM

## 2020-06-17 DIAGNOSIS — M86141 Other acute osteomyelitis, right hand: Secondary | ICD-10-CM

## 2020-06-17 DIAGNOSIS — Z89021 Acquired absence of right finger(s): Secondary | ICD-10-CM

## 2020-06-17 LAB — HEPATITIS B SURFACE ANTIBODY, QUANTITATIVE: Hep B S AB Quant (Post): 193.9 m[IU]/mL (ref >9.9)

## 2020-06-17 MED ORDER — VANCOMYCIN IV (FOR PTA / DISCHARGE USE ONLY): 1500.0000 mg | Freq: Two times a day (BID) | INTRAVENOUS | 0 refills | Status: DC

## 2020-06-17 MED ORDER — CEFTRIAXONE IV (FOR PTA / DISCHARGE USE ONLY): 2.0000 g | INTRAVENOUS | 0 refills | Status: DC

## 2020-06-17 NOTE — TOC Initial Note (Addendum)
Transition of Care [facility]) - Initial/Assessment Note    Patient Details  Name: Jacob Sanchez MRN: [id number] Date of Birth: 1966-04-09  Transition of Care [facility]) CM/SW Contact:    Jacob Labrum, RN Phone Number: 06/17/2020, 1:57 PM  Clinical Narrative:                 Case management met with the patient at the bedside regarding transitions of care to home with IV antibiotics.  The patient states that he lives alone but is able to receive assistance from his son Warehouse manager) and daughter who live near by to the patient's home.  The patient is currently working as a Armed forces training and education officer and is currently uninsured.  The patient is willing and able to receive teaching and has available resources to afford home health care for IV antibiotics.  The PICC line is in place to right arm.  The patient is being followed by Jacob Sanchez, RNCM at [facility] and he will be set up through [facility] for RN.  The patient will need a Vancomycin Trough level at 4:30 pm today. Jacob Sanchez, RNCM with Jacob Sanchez is going to teach the patient this afternoon about coordinating IV antibiotics home delivery and private pay for home health RN through [place] for labs and dressing changes for RN.  The patient will mostly likely discharge home tomorrow due to labwork this afternoon and coordination of IV antibiotics and delivery to the home.  TOC will continue to follow for discharge needs to home.  06/17/2020 1600 - Jacob Sanchez, RNCM at [facility] is at the bedside to teach the patient and coordinate delivery of the patient's IV antibiotics to his home.  The patient will not be able to discharge to home until the morning but teaching will be complete and home health RN was changed and set up with [facility].  I spoke to the patient and he is aware and plans to discharge to home in the morning.  Expected Discharge Plan: Early Barriers to  Discharge: Inadequate or no insurance,Continued Medical Work up   Patient Goals and CMS Choice Patient states their goals for this hospitalization and ongoing recovery are:: Patient plans to discharge home with IV antibiotics through PICC line. CMS Medicare.gov Compare Post Acute Care list provided to:: Patient Choice offered to / list presented to : Patient  Expected Discharge Plan and Services Expected Discharge Plan: [place]   Discharge Planning Services: CM Consult,Medication Assistance Post Acute Care Choice: [place] Living arrangements for the past 2 months: Single Family Home                 DME Arranged: IV pump/equipment (IV supplies and antibiotics through [facility])         HH Arranged: RN [place] Agency: [facility] Date [place]: 06/17/20 Time [place] Day: [place] Representative spoke with at [place]: Jacob Sanchez, [place] with [facility]  Prior Living Arrangements/Services Living arrangements for the past 2 months: Single Family Home Lives with:: Self Patient language and need for interpreter reviewed:: Yes Do you feel safe going back to the place where you live?: Yes      Need for Family Participation in Patient Care: Yes (Comment) Care giver support system in place?: Yes (comment)   Criminal Activity/Legal Involvement Pertinent to Current Situation/Hospitalization: No - Comment as needed  Activities of Daily Living Home Assistive Devices/Equipment: None ADL Screening (condition at time of admission) Patient's cognitive ability adequate to safely complete daily activities?: Yes Is the patient deaf or have difficulty hearing?: No Does the patient have difficulty seeing, even when wearing glasses/contacts?: No Does the patient have difficulty concentrating, remembering, or making decisions?: No Patient able to express need  for assistance with ADLs?: Yes Does the patient have difficulty dressing or bathing?: No Independently performs ADLs?: Yes (appropriate for developmental age) Does the patient have difficulty walking or climbing stairs?: No Weakness of Legs: None Weakness of Arms/Hands: None  Permission Sought/Granted Permission sought to share information with : Case Manager Permission granted to share information with : Yes, Verbal Permission Granted     Permission granted to share info w AGENCY: Jacob Sanchez, RNCM with [facility] for RN  Permission granted to share info w Relationship: family contacts     Emotional Assessment Appearance:: Appears stated age Attitude/Demeanor/Rapport: Self-Confident Affect (typically observed): Accepting Orientation: : Oriented to Self,Oriented to Place,Oriented to  Time,Oriented to Situation Alcohol / Substance Use: Not Applicable Psych Involvement: No (comment)  Admission diagnosis:  Osteomyelitis ([place]) [M86.9] Osteomyelitis of finger of right hand [facility]) [M86.9] Patient Active Problem List   Diagnosis Date Noted  . Osteomyelitis of finger of right hand ([place]) 06/15/2020  . Wound, open 05/22/2020  . Osteomyelitis ([place]) 05/22/2020  . Medication monitoring encounter 05/22/2020  . Immunization due 05/22/2020  . Osteomyelitis of right hand ([place]) 03/29/2020  . 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 Biceps tendon tear 07/06/2016  . DDD (degenerative disc disease), lumbar 05/29/2016  . Chronic bilateral low back pain 04/27/2016  . Insomnia due to medical condition 04/27/2016   PCP:  Virl Son., MD Pharmacy:   [facility], [place] - [address] [place] [address] Phone: [phone] Fax: [phone]     Social Determinants of Health (SDOH) Interventions    Readmission Risk Interventions No flowsheet data found.

## 2020-06-17 NOTE — Progress Notes (Signed)
Patient ID: Jacob Sanchez, male   DOB: [birth year], 54 y.o.   MRN: [id number]  Orthopedics aware of pt's admission, agree with ID attempt to cure osteo medically and save finger. Only surgical option here is amputation which the patient is not currently interested in pursuing. Will see pt again as outpatient after completion of ID course of treatment.    Freeman Caldron, PA-C Orthopedic Surgery [phone]

## 2020-06-17 NOTE — Plan of Care (Signed)

## 2020-06-17 NOTE — Progress Notes (Signed)
PHARMACY CONSULT NOTE FOR:  OUTPATIENT  PARENTERAL ANTIBIOTIC THERAPY (OPAT)  Indication: Osteomyelitis of right middle finger Regimen: Vancomycin 1500 mg IV Q 12 hours + Ceftriaxone 2 gm IV Q 24 hours  End date: 07/26/2020  IV antibiotic discharge orders are pended. To discharging provider:  please sign these orders via discharge navigator,  Select New Orders & click on the button choice - Manage This Unsigned Work.     Thank you for allowing pharmacy to be a part of this patient's care.  Sharin Mons, PharmD, BCPS, BCIDP Infectious Diseases Clinical Pharmacist Phone: [phone] 06/17/2020, 10:09 AM

## 2020-06-17 NOTE — Progress Notes (Signed)
PROGRESS NOTE   Jacob Sanchez  TDV:[id number]    DOB: [birth year]    DOA: 06/14/2020  PCP: Virl Son., MD   I have briefly reviewed patients previous medical records in [facility].  Chief Complaint  Patient presents with  . Osteomyelitis     Brief Narrative:  54 year old male with history of hypertension, chronic back pain, right hand carpal tunnel release with related numbness in all his fingers and palm on the side, work injury related back pain/foot drop for which he underwent emergency laminectomy, osteomyelitis of right index finger, s/p partial amputation by Dr. Jeannie Fend and operative cultures grew MSSA, completed 6 weeks course of oral Bactrim until 11/24 when seen by ID and antibiotics were stopped, has had chronic right middle finger distal phalanx wound since August 2021, progressively got worse since stopping antibiotics more so in the last week with associated pain and swelling.  He was seen by Dr. Jeannie Fend and MRI showed osteomyelitis of right middle finger, referred back to ID and sent to the hospital for IV antibiotics, evaluation and management.  ID consulted, ceftriaxone and vancomycin continued for now.  Orthopedics aware from EDP on admission but have not seen, do not think there is much for them to do at this time.  Right upper arm PICC line placed 12/18.  As per ID follow-up, patient to discharge on vancomycin and ceftriaxone for total of 6 weeks 07/26/2020.  Has outpatient ID follow-up arranged for 07/11/2020 at 9:45 AM.  As per discussion with TOC team, patient's home antibiotics and home health RN will not be ready until tomorrow and hence likely DC home 1/21.   Assessment & Plan:  Active Problems:   Osteomyelitis (HCC)   Osteomyelitis of finger of right hand (HCC)   Osteomyelitis of right middle finger distal phalanx, with wounds  As copied from ID 06/16/2020: "54 y.o. male with with fairly significant numbness from carpal tunnel syndrome who  sustained a burn to his middle finger (and likely also to his index finger) went on to develop osteomyelitis of his index finger which required amputation in which yielded a MSSA species on operative culture now with osteomyelitis of his middle finger."  Had chronic wound for several months.  Progressive worsening since stoppage of Bactrim 05/22/2020, more so in the last week.  Seen by Dr. Jeannie Fend as outpatient, MRI showed osteomyelitis, referred to ID and was sent to the hospital for admission and IV antibiotics.  Digit threatening infection given prior amputation of index finger.  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  Empirically started on IV ceftriaxone and vancomycin.  ID follow-up appreciated.  Patient does not have insurance until first of next year.  Daptomycin for outpatient not approved by charity care.  ID thereby recommends IV vancomycin and ceftriaxone for 6 weeks through 07/26/2020.  Has ID follow-up on 07/11/2020 at 9:45 AM.  CRP 0.6.  ESR 2.  Blood cultures pending.  RUE line placed 12/18.  Pain management.  As per discussion with TOC team, patient's home antibiotics and home health RN will not be ready until tomorrow and hence likely DC home 1/21.  Orthopedics input appreciated: Only surgical option here is amputation and we are currently trying to salvage his finger by treating medically.  Patient not interested in amputation at this time.  Outpatient follow-up with orthopedics  Right index finger MSSA osteomyelitis, s/p partial amputation  Healed without acute issues now.  Mild transaminitis  Unclear etiology.  Stable.  No  GI symptoms.  Will repeat CMP in a.m.  Essential hypertension  Soft blood pressures at times.  BP of 93/57 very early this morning.  Continue atenolol 25 mg daily, lisinopril 10 mg daily.  Held triamterene-HCTZ until DC.  This is his home regimen.  Patient feels that his blood pressures are lower here because he  is on a low-salt diet.  He also feels that blood pressures are lower because he is not taking Adderall which he only takes when he is at school.  Monitor closely with above changes.  Patient asymptomatic.  Chronic back pain  Pain management  Chronic anxiety  Continue home Valium as needed  ADHD  Adderall on hold for now  Body mass index is 28.12 kg/m.    DVT prophylaxis: enoxaparin (LOVENOX) injection 40 mg Start: 06/15/20 1000     Code Status: Full Code Family Communication: None at bedside Disposition:  Status is: Observation  The patient will require care spanning > 2 midnights and should be moved to inpatient because: IV treatments appropriate due to intensity of illness or inability to take PO and Inpatient level of care appropriate due to severity of illness  Dispo:  Patient From: Home  Planned Disposition: Home with Health Care Svc  Expected discharge date: 06/18/2020  Medically stable for discharge: Yes.  Consultants:   Infectious disease Orthopedics  Procedures:   None  Antimicrobials:    Anti-infectives (From admission, onward)   Start     Dose/Rate Route Frequency Ordered Stop   06/17/20 0000  cefTRIAXone (ROCEPHIN) IVPB        2 g Intravenous Every 24 hours 06/17/20 1522 07/26/20 2359   06/17/20 0000  vancomycin IVPB        1,500 mg Intravenous Every 12 hours 06/17/20 1522 07/26/20 2359   06/15/20 1200  vancomycin (VANCOREADY) IVPB 1500 mg/300 mL       "Followed by" Linked Group Details   1,500 mg 150 mL/hr over 120 Minutes Intravenous Every 12 hours 06/14/20 2259     06/14/20 2300  vancomycin (VANCOREADY) IVPB 2000 mg/400 mL       "Followed by" Linked Group Details   2,000 mg 200 mL/hr over 120 Minutes Intravenous  Once 06/14/20 2259 06/15/20 0204   06/14/20 2245  cefTRIAXone (ROCEPHIN) 2 g in sodium chloride 0.9 % 100 mL IVPB        2 g 200 mL/hr over 30 Minutes Intravenous Every 24 hours 06/14/20 2244          Subjective:  No dizziness  or weakness reported today.  Reports that he has been walking around in the hallway without complaints.  No pain reported either.  Objective:   Vitals:   06/17/20 0341 06/17/20 0756 06/17/20 0859 06/17/20 1419  BP: (!) 93/57 103/71 113/72 121/77  Pulse: 69 62 68 69  Resp: _0 Temp: 98.2 F (36.8 C) 98.3 F (36.8 C) 97.8 F (36.6 C) 97.9 F (36.6 C)  TempSrc: Oral Oral Oral Oral  SpO2: 97% 96% 97% 98%  Weight:      Height:        General exam: Pleasant young male, moderately built and nourished lying comfortably propped up in bed without distress. Respiratory system: Clear to auscultation. Respiratory effort normal. Cardiovascular system: S1 & S2 heard, RRR. No JVD, murmurs, rubs, gallops or clicks. No pedal edema. Gastrointestinal system: Abdomen is nondistended, soft and nontender. No organomegaly or masses felt. Normal bowel sounds heard. Central nervous system: Alert and  oriented. No focal neurological deficits. Extremities: Symmetric 5 x 5 power.  Right middle finger distal phalanx has been dressed which is clean and dry. Skin: No rashes, lesions or ulcers Psychiatry: Judgement and insight appear normal. Mood & affect appropriate.       Data Reviewed:   I have personally reviewed following labs and imaging studies   CBC: Recent Labs  Lab 06/14/20 1326 06/15/20 0347 06/16/20 0214  WBC 7.2 7.4 6.9  NEUTROABS 4.4  --   --   HGB 16.1 15.5 16.5  HCT 48.2 43.2 46.2  MCV 94.1 91.9 91.7  PLT 234 206 161    Basic Metabolic Panel: Recent Labs  Lab 06/14/20 1326 06/15/20 0347 06/16/20 0214  NA 137 135 135  K 3.9 3.6 4.0  CL 104 104 101  CO2 _0 GLUCOSE 117* 117* 119*  BUN _1 CREATININE 0.94 0.95 1.00  CALCIUM 10.5* 10.1 10.5*    Liver Function Tests: Recent Labs  Lab 06/14/20 1326 06/15/20 0347 06/16/20 0214  AST 63* 42* 52*  ALT 107* 81* 99*  ALKPHOS 57 51 52  BILITOT 0.8 0.7 0.5  PROT 6.9 6.2* 6.6  ALBUMIN 3.8 3.4* 3.4*     CBG: No results for input(s): GLUCAP in the last 168 hours.  Microbiology Studies:   Recent Results (from the past 240 hour(s))  Resp Panel by RT-PCR (Flu A&B, Covid) Nasopharyngeal Swab     Status: None   Collection Time: 06/14/20  1:22 PM   Specimen: Nasopharyngeal Swab; Nasopharyngeal(NP) swabs in vial transport medium  Result Value Ref Range Status   SARS Coronavirus 2 by RT PCR NEGATIVE NEGATIVE Final    Comment: (NOTE) SARS-CoV-2 target nucleic acids are NOT DETECTED.  The SARS-CoV-2 RNA is generally detectable in upper respiratory specimens during the acute phase of infection. The lowest concentration of SARS-CoV-2 viral copies this assay can detect is 138 copies/mL. A negative result does not preclude SARS-Cov-2 infection and should not be used as the sole basis for treatment or other patient management decisions. A negative result may occur with  improper specimen collection/handling, submission of specimen other than nasopharyngeal swab, presence of viral mutation(s) within the areas targeted by this assay, and inadequate number of viral copies(<138 copies/mL). A negative result must be combined with clinical observations, patient history, and epidemiological information. The expected result is Negative.  Fact Sheet for Patients:  EntrepreneurPulse.com.au  Fact Sheet for Healthcare Providers:  IncredibleEmployment.be  This test is no t yet approved or cleared by the [place] FDA and  has been authorized for detection and/or diagnosis of SARS-CoV-2 by FDA under an Emergency Use Authorization (EUA). This EUA will remain  in effect (meaning this test can be used) for the duration of the COVID-19 declaration under Section 564(b)(1) of the Act, 21 U.S.C.section 360bbb-3(b)(1), unless the authorization is terminated  or revoked sooner.       Influenza A by PCR NEGATIVE NEGATIVE Final   Influenza B by PCR NEGATIVE NEGATIVE  Final    Comment: (NOTE) The Xpert Xpress SARS-CoV-2/FLU/RSV plus assay is intended as an aid in the diagnosis of influenza from Nasopharyngeal swab specimens and should not be used as a sole basis for treatment. Nasal washings and aspirates are unacceptable for Xpert Xpress SARS-CoV-2/FLU/RSV testing.  Fact Sheet for Patients: EntrepreneurPulse.com.au  Fact Sheet for Healthcare Providers: IncredibleEmployment.be  This test is not yet approved or cleared by the [place] FDA and has been authorized for detection and/or  diagnosis of SARS-CoV-2 by FDA under an Emergency Use Authorization (EUA). This EUA will remain in effect (meaning this test can be used) for the duration of the COVID-19 declaration under Section 564(b)(1) of the Act, 21 U.S.C. section 360bbb-3(b)(1), unless the authorization is terminated or revoked.  Performed at [facility], [place] [address], [place], [place] [address]   Blood culture (routine x 2)     Status: None (Preliminary result)   Collection Time: 06/14/20  1:26 PM   Specimen: BLOOD  Result Value Ref Range Status   Specimen Description BLOOD BLOOD LEFT FOREARM  Final   Special Requests   Final    BOTTLES DRAWN AEROBIC ONLY Blood Culture adequate volume   Culture   Final    NO GROWTH 3 DAYS Performed at [facility], [place] [address] [place], [place] [address]    Report Status PENDING  Incomplete  Blood culture (routine x 2)     Status: None (Preliminary result)   Collection Time: 06/14/20  1:41 PM   Specimen: BLOOD  Result Value Ref Range Status   Specimen Description BLOOD LEFT ANTECUBITAL  Final   Special Requests   Final    BOTTLES DRAWN AEROBIC AND ANAEROBIC Blood Culture adequate volume   Culture   Final    NO GROWTH 3 DAYS Performed at [facility], [place] [address] [place], [place] [address]    Report Status PENDING  Incomplete     Radiology Studies:  No results  found.   Scheduled Meds:   . atenolol  25 mg Oral Daily  . Chlorhexidine Gluconate Cloth  6 each Topical Daily  . enoxaparin (LOVENOX) injection  40 mg Subcutaneous Daily  . lisinopril  10 mg Oral Daily  . multivitamin with minerals  1 tablet Oral Daily  . sodium chloride flush  10-40 mL Intracatheter Q12H    Continuous Infusions:   . cefTRIAXone (ROCEPHIN)  IV 2 g (06/16/20 2207)  . vancomycin 1,500 mg (06/17/20 0530)     LOS: 2 days     Vernell Leep, MD, [place], [facility]. [facility]    To contact the attending provider between 7A-7P or the covering provider during after hours 7P-7A, please log into the web site [email or web address] and access using universal [place] password for that web site. If you do not have the password, please call the hospital operator.  06/17/2020, 3:23 PM

## 2020-06-17 NOTE — Progress Notes (Signed)
[facility]                                                             [place], [place], [place], [address]                                                                  Phn. 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  Plan Patient has a PICC line in place and currently getting IV Vancomycin and ceftriaxone. Feasibility of Daptomycin by [facility] care is not approved. Hence, will continue Vancomycin and ceftriaxone as is. Will need for a total of 6 weeks. Today is Day 4.   Weekly CBC, CMP ( mild transaminitis- HIV NR, HCV ab NR and HB surface ag NR) , ESR and CRP to be faxed to [place] clinic  Follow up with myself has been made on Jan 13 at 9:45 am at [facility].  All questions and concerns were discussed and addressed.    Rosiland Oz, MD [facility]  Office phone [phone] Fax no. [phone] ______________________________________________________________________________________________________________________ Subjective Patient is tolerating IV abx well and denies any issues. He already has a PICC line in his rt arm. He has questions regarding whether he would be able to work with the PICC line in his rt arm. I said there is a weight limit for his rt arm with PICC line in place. I told him to discuss the weight limits with the [facility] to know exactly how much is it. I also discussed with him the possibility of  transition to IV to PO eventually before completion of 6 weeks course of IV. Overall he is doing well and wants to go home today.    ROS- 11 point ROS negative except as stated above  Past Medical History:  Diagnosis Date  . Back pain  hx laminectomy  . Hypertension    Past Surgical History:  Procedure Laterality Date  . AMPUTATION Right 04/01/2020   Procedure: Right  index finger partial amputation;  Surgeon: Verner Mould, MD;  Location: [facility];  Service: Orthopedics;  Laterality: Right;  with block  . CARPAL TUNNEL RELEASE Right   . KNEE SURGERY Right   . LAMINECTOMY     No current facility-administered medications on file prior to encounter.   Current Outpatient Medications on File Prior to Encounter  Medication Sig Dispense Refill  . acetaminophen (TYLENOL) 500 MG tablet Take 1,000 mg by mouth every 6 (six) hours as needed for mild pain.    Marland Kitchen amphetamine-dextroamphetamine (ADDERALL) 10 MG tablet Take 10 mg by mouth daily with breakfast.    . atenolol (TENORMIN) 25 MG tablet Take 25 mg by mouth daily.     . diazepam (VALIUM) 10 MG tablet Take 10 mg by mouth daily as needed (Muscle Spasms).    Marland Kitchen lisinopril (ZESTRIL) 10 MG tablet Take 10 mg by mouth daily.    . sildenafil (REVATIO) 20 MG tablet Take 20 mg by mouth daily as needed (for ED).    Marland Kitchen triamterene-hydrochlorothiazide (MAXZIDE-25) 37.5-25 MG tablet Take 1 tablet by mouth daily.     Social History   Socioeconomic History  . Marital status: Single    Spouse name: Not on file  . Number of children: 2  . Years of education: Not on file  . Highest education level: Not on file  Occupational History  . Occupation: Architect  Tobacco Use  . Smoking status: Never Smoker  . Smokeless tobacco: Current User    Types: Snuff  Vaping Use  . Vaping Use: Never used  Substance and Sexual Activity  . Alcohol use: Yes    Comment: socially  . Drug use: Never  . Sexual activity: Yes    Partners: Female  Other  Topics Concern  . Not on file  Social History Narrative  . Not on file   Social Determinants of Health   Financial Resource Strain: Not on file  Food Insecurity: Not on file  Transportation Needs: Not on file  Physical Activity: Not on file  Stress: Not on file  Social Connections: Not on file  Intimate Partner Violence: Not on file    Vitals BP 113/72 (BP Location: Left Arm)   Pulse 68   Temp 97.8 F (36.6 C) (Oral)   Resp 20   Ht _0  (1.905 m)   Wt 102.1 kg   SpO2 97%   BMI 28.12 kg/m   Examination  General - not in acute distress, comfortably sitting in chair HEENT - PEERLA, no pallor and no icterus Chest - b/l clear air entry, no additional sounds CVS- Normal s1s2, RRR Abdomen - Soft, Non tender , non distended Ext- no pedal edema RT hand      Neuro: grossly normal Back - WNL Psych : calm and cooperative   Recent labs CBC Latest Ref Rng & Units 06/16/2020 06/15/2020 06/14/2020  WBC 4.0 - 10.5 K/uL 6.9 7.4 7.2  Hemoglobin 13.0 - 17.0 g/dL 16.5 15.5 16.1  Hematocrit 39.0 - 52.0 % 46.2 43.2 48.2  Platelets 150 - 400 K/uL 210 206 234   CMP Latest Ref Rng & Units 06/16/2020 06/15/2020 06/14/2020  Glucose 70 - 99 mg/dL 119(H) 117(H) 117(H)  BUN 6 - 20 mg/dL _1 Creatinine 0.61 - 1.24 mg/dL 1.00 0.95 0.94  Sodium 135 - 145 mmol/L  135 135 137  Potassium 3.5 - 5.1 mmol/L 4.0 3.6 3.9  Chloride 98 - 111 mmol/L 101 104 104  CO2 22 - 32 mmol/L _0 Calcium 8.9 - 10.3 mg/dL 10.5(H) 10.1 10.5(H)  Total Protein 6.5 - 8.1 g/dL 6.6 6.2(L) 6.9  Total Bilirubin 0.3 - 1.2 mg/dL 0.5 0.7 0.8  Alkaline Phos 38 - 126 U/L 52 51 57  AST 15 - 41 U/L 52(H) 42(H) 63(H)  ALT 0 - 44 U/L 99(H) 81(H) 107(H)     Pertinent Microbiology Results for orders placed or performed during the hospital encounter of 06/14/20  Resp Panel by RT-PCR (Flu A&B, Covid) Nasopharyngeal Swab     Status: None   Collection Time: 06/14/20  1:22 PM   Specimen: Nasopharyngeal Swab;  Nasopharyngeal(NP) swabs in vial transport medium  Result Value Ref Range Status   SARS Coronavirus 2 by RT PCR NEGATIVE NEGATIVE Final    Comment: (NOTE) SARS-CoV-2 target nucleic acids are NOT DETECTED.  The SARS-CoV-2 RNA is generally detectable in upper respiratory specimens during the acute phase of infection. The lowest concentration of SARS-CoV-2 viral copies this assay can detect is 138 copies/mL. A negative result does not preclude SARS-Cov-2 infection and should not be used as the sole basis for treatment or other patient management decisions. A negative result may occur with  improper specimen collection/handling, submission of specimen other than nasopharyngeal swab, presence of viral mutation(s) within the areas targeted by this assay, and inadequate number of viral copies(<138 copies/mL). A negative result must be combined with clinical observations, patient history, and epidemiological information. The expected result is Negative.  Fact Sheet for Patients:  EntrepreneurPulse.com.au  Fact Sheet for Healthcare Providers:  IncredibleEmployment.be  This test is no t yet approved or cleared by the [place] FDA and  has been authorized for detection and/or diagnosis of SARS-CoV-2 by FDA under an Emergency Use Authorization (EUA). This EUA will remain  in effect (meaning this test can be used) for the duration of the COVID-19 declaration under Section 564(b)(1) of the Act, 21 U.S.C.section 360bbb-3(b)(1), unless the authorization is terminated  or revoked sooner.       Influenza A by PCR NEGATIVE NEGATIVE Final   Influenza B by PCR NEGATIVE NEGATIVE Final    Comment: (NOTE) The Xpert Xpress SARS-CoV-2/FLU/RSV plus assay is intended as an aid in the diagnosis of influenza from Nasopharyngeal swab specimens and should not be used as a sole basis for treatment. Nasal washings and aspirates are unacceptable for Xpert Xpress  SARS-CoV-2/FLU/RSV testing.  Fact Sheet for Patients: EntrepreneurPulse.com.au  Fact Sheet for Healthcare Providers: IncredibleEmployment.be  This test is not yet approved or cleared by the [place] FDA and has been authorized for detection and/or diagnosis of SARS-CoV-2 by FDA under an Emergency Use Authorization (EUA). This EUA will remain in effect (meaning this test can be used) for the duration of the COVID-19 declaration under Section 564(b)(1) of the Act, 21 U.S.C. section 360bbb-3(b)(1), unless the authorization is terminated or revoked.  Performed at [facility], [place] [address] [place], [place] [address]   Blood culture (routine x 2)     Status: None (Preliminary result)   Collection Time: 06/14/20  1:26 PM   Specimen: BLOOD  Result Value Ref Range Status   Specimen Description BLOOD BLOOD LEFT FOREARM  Final   Special Requests   Final    BOTTLES DRAWN AEROBIC ONLY Blood Culture adequate volume   Culture   Final  NO GROWTH 3 DAYS Performed at [facility], [place] [address], [place], [place] [address]    Report Status PENDING  Incomplete  Blood culture (routine x 2)     Status: None (Preliminary result)   Collection Time: 06/14/20  1:41 PM   Specimen: BLOOD  Result Value Ref Range Status   Specimen Description BLOOD LEFT ANTECUBITAL  Final   Special Requests   Final    BOTTLES DRAWN AEROBIC AND ANAEROBIC Blood Culture adequate volume   Culture   Final    NO GROWTH 3 DAYS Performed at [facility], [place] [address], [place], [place] [address]    Report Status PENDING  Incomplete     Pertinent Imaging      All pertinent labs/Imagings/notes reviewed. All pertinent plain films and CT images have been personally visualized and interpreted; radiology reports have been reviewed. Decision making incorporated into the Impression / Recommendations.  I spent greater than 30  minutes with the patient  including greater than 50% of time in face to face counsel of the patient and in coordination of their care.

## 2020-06-17 NOTE — Progress Notes (Signed)
Pharmacy Antibiotic Note  Jacob Sanchez is a 54 y.o. male admitted on 06/14/2020 with R index finger osteomyelitis. Pharmacy has been consulted for Vancomycin dosing along with Rocephin per MD.  ID has been consulted and the plan is to continue Vancomycin + Rocephin thru 07/26/20. A Vancomycin trough was planned for today however due to a missed dose on 12/19 - this will be moved to 12/21 AM. No changes in dosing for now.  Plan: - Continue Vancomycin 1500 mg IV every 12 hours - Will order a Vancomycin trough for 12/21 AM - Will continue to follow renal function, culture results, LOT, and antibiotic de-escalation plans   Height: 6\' 3"  (190.5 cm) Weight: 102.1 kg (225 lb) IBW/kg (Calculated) : 84.5  Temp (24hrs), Avg:98.1 F (36.7 C), Min:97.5 F (36.4 C), Max:98.5 F (36.9 C)  Recent Labs  Lab 06/14/20 1326 06/14/20 2314 06/15/20 0347 06/16/20 0214  WBC 7.2  --  7.4 6.9  CREATININE 0.94  --  0.95 1.00  LATICACIDVEN 1.9 1.8  --   --     Estimated Creatinine Clearance: 108 mL/min (by C-G formula based on SCr of 1 mg/dL).    Allergies  Allergen Reactions  . Bee Venom Anaphylaxis  . Demerol [Meperidine] Anaphylaxis  . Codeine Rash    Antimicrobials this admission: Vanc 12/17 >> Rocephin 12/17 >>  Dose adjustments this admission: n/a  Microbiology results: 12/17 COVID >> neg 12/17 BCx >> ngtd  Thank you for allowing pharmacy to be a part of this patient's care.  1/18, PharmD, BCPS Clinical Pharmacist Clinical phone for 06/17/2020: [phone] 06/17/2020 12:21 PM   **Pharmacist phone directory can now be found on [email or web address] ([email or web address]).  Listed under [facility].

## 2020-06-18 LAB — COMPREHENSIVE METABOLIC PANEL
ALT: 125 U/L — ABNORMAL HIGH (ref 0–44)
AST: 71 U/L — ABNORMAL HIGH (ref 15–41)
Albumin: 3.2 g/dL — ABNORMAL LOW (ref 3.5–5.0)
Alkaline Phosphatase: 48 U/L (ref 38–126)
Anion gap: 6 (ref 5–15)
BUN: 13 mg/dL (ref 6–20)
CO2: 24 mmol/L (ref 22–32)
Calcium: 10.3 mg/dL (ref 8.9–10.3)
Chloride: 105 mmol/L (ref 98–111)
Creatinine, Ser: 0.88 mg/dL (ref 0.61–1.24)
GFR, Estimated: >60 mL/min (ref >60)
Glucose, Bld: 127 mg/dL — ABNORMAL HIGH (ref 70–99)
Potassium: 3.8 mmol/L (ref 3.5–5.1)
Sodium: 135 mmol/L (ref 135–145)
Total Bilirubin: 0.5 mg/dL (ref 0.3–1.2)
Total Protein: 6.2 g/dL — ABNORMAL LOW (ref 6.5–8.1)

## 2020-06-18 LAB — VANCOMYCIN, TROUGH: Vancomycin Tr: 11 ug/mL — ABNORMAL LOW (ref 15–20)

## 2020-06-18 MED ORDER — VANCOMYCIN IV (FOR PTA / DISCHARGE USE ONLY): 1750.0000 mg | Freq: Two times a day (BID) | INTRAVENOUS | 0 refills | Status: DC

## 2020-06-18 MED ORDER — VANCOMYCIN HCL 1750 MG/350ML IV SOLN
1750.0000 mg | Freq: Two times a day (BID) | INTRAVENOUS | Status: DC
  Filled 2020-06-18 (×2): qty 350

## 2020-06-18 MED ORDER — OXYCODONE-ACETAMINOPHEN 5-325 MG PO TABS: 1.0000 | ORAL_TABLET | Freq: Four times a day (QID) | ORAL | 0 refills | Status: DC | PRN

## 2020-06-18 MED ORDER — HEPARIN SOD (PORK) LOCK FLUSH 100 UNIT/ML IV SOLN
250.0000 [IU] | INTRAVENOUS | Status: AC | PRN
  Administered 2020-06-18: 18:00:00 250 [IU]
  Filled 2020-06-18: qty 2.5
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
## 2020-06-18 NOTE — Progress Notes (Signed)
Discharge instructions addressed; Pt in stable condition; Pt.'s father in room and will be pt.'s ride home.

## 2020-06-18 NOTE — TOC Transition Note (Signed)
Transition of Care [facility]) - CM/SW Discharge Note   Patient Details  Name: HUGO LYBRAND MRN: [id number] Date of Birth: June 28, 1966  [facility]) CM/SW Contact:  Lawerance Sabal, RN Phone Number: 06/18/2020, 2:01 PM   Clinical Narrative:   HH services arranged through LOG with [facility]. IV medications arranged through [facility].  Education on PICC line complete by Tonny Bollman. MD notified that patient is ready for DC.     Final next level of care: Home w Home Health Services Barriers to Discharge: No Barriers Identified   Patient Goals and CMS Choice Patient states their goals for this hospitalization and ongoing recovery are:: Patient plans to discharge home with IV antibiotics through PICC line. CMS Medicare.gov Compare Post Acute Care list provided to:: Patient Choice offered to / list presented to : Patient  Discharge Placement                       Discharge Plan and Services   Discharge Planning Services: CM Consult,Medication Assistance Post Acute Care Choice: Durable Medical Equipment,Home Health          DME Arranged: IV pump/equipment (IV supplies and antibiotics through [facility])         HH Arranged: RN HH Agency: [facility] Date [facility] Agency Contacted: 06/18/20 Time HH Agency Contacted: 1401 Representative spoke with at [facility] Agency: Kandee Keen  Social Determinants of Health (SDOH) Interventions     Readmission Risk Interventions No flowsheet data found.

## 2020-06-18 NOTE — TOC Progression Note (Signed)
Transition of Care [facility]) - Progression Note    Patient Details  Name: STAN CANTAVE MRN: [id number] Date of Birth: [birth year]  Transition of Care [facility]) CM/SW Contact  Lawerance Sabal, RN Phone Number: 06/18/2020, 10:55 AM  Clinical Narrative:   Notified by Jeri Modena that patient could not be served through [facility] agency previously accepted. Referral made to [facility] for charity care, case is pending.     Expected Discharge Plan: Home w Home Health Services Barriers to Discharge: Inadequate or no insurance,Continued Medical Work up  Expected Discharge Plan and Services Expected Discharge Plan: Home w Home Health Services   Discharge Planning Services: CM Consult,Medication Assistance Post Acute Care Choice: Durable Medical Equipment,Home Health Living arrangements for the past 2 months: Single Family Home                 DME Arranged: IV pump/equipment (IV supplies and antibiotics through [facility])         HH Arranged: RN HH Agency: [facility]) Date HH Agency Contacted: 06/17/20 Time HH Agency Contacted: 1351 Representative spoke with at [facility] Agency: Jeri Modena, RNCM with [facility]   Social Determinants of Health (SDOH) Interventions    Readmission Risk Interventions No flowsheet data found.

## 2020-06-18 NOTE — Progress Notes (Signed)
Pharmacy Antibiotic Note  Jacob Sanchez is a 54 y.o. male admitted on 06/14/2020 with R index finger osteomyelitis. Pharmacy has been consulted for Vancomycin dosing along with Rocephin per MD.  ID has been consulted and the plan is to continue Vancomycin + Rocephin thru 07/26/20.   A Vancomycin trough this morning was SUBtherapeutic (VT 11 mcg/ml, goal of 15-20 mcg/ml).  Renal function remains stable. Will increase and keep with q12h dosing for planned discharge home with home health. Would recommend to repeat a VT on Thursday, 12/23.   Plan: - Adjust Vancomycin to 1750 mg IV every 12 hours - Would recommend to repeat a VT on Thursday, 12/23 by home health - Will continue to follow renal function, culture results, LOT, and antibiotic de-escalation plans   Height: 6\' 3"  (190.5 cm) Weight: 102.1 kg (225 lb) IBW/kg (Calculated) : 84.5  Temp (24hrs), Avg:97.8 F (36.6 C), Min:97.4 F (36.3 C), Max:98 F (36.7 C)  Recent Labs  Lab 06/14/20 1326 06/14/20 2314 06/15/20 0347 06/16/20 0214 06/18/20 0420  WBC 7.2  --  7.4 6.9  --   CREATININE 0.94  --  0.95 1.00 0.88  LATICACIDVEN 1.9 1.8  --   --   --   VANCOTROUGH  --   --   --   --  11*    Estimated Creatinine Clearance: 122.8 mL/min (by C-G formula based on SCr of 0.88 mg/dL).    Allergies  Allergen Reactions  . Bee Venom Anaphylaxis  . Demerol [Meperidine] Anaphylaxis  . Codeine Rash    Antimicrobials this admission: Vanc 12/17 >> Rocephin 12/17 >>  Dose adjustments this admission: 12/21: VT 11 mcg/ml on 1500 mg/12h >> increase to 1750 mg/12h   Microbiology results: 12/17 COVID >> neg 12/17 BCx >> ngtd  Thank you for allowing pharmacy to be a part of this patient's care.  1/18, PharmD, BCPS Clinical Pharmacist Clinical phone for 06/18/2020: 06/20/2020 06/18/2020 11:17 AM   **Pharmacist phone directory can now be found on [email or web address] ([email or web address]).  Listed under [facility].

## 2020-06-18 NOTE — Plan of Care (Signed)
Patient is alert, oriented, and independent. Right upper arm PICC line in place with fluids going KVO and IV antibx given as ordered. Right middle finger covered with gauze dsg clean dry and intact. Plan for discharge home tomorrow with PICC line for IV antibx. Will continue to monitor and continue current POC.

## 2020-06-18 NOTE — Progress Notes (Signed)
PHARMACY CONSULT NOTE FOR:  OUTPATIENT  PARENTERAL ANTIBIOTIC THERAPY (OPAT)  Indication: Osteomyelitis of right middle finger Regimen: Vancomycin 1750 mg IV Q 12 hours + Ceftriaxone 2 gm IV Q 24 hours  End date: 07/26/2020  IV antibiotic discharge orders are pended. To discharging provider:  please sign these orders via discharge navigator,  Select New Orders & click on the button choice - Manage This Unsigned Work.     Thank you for allowing pharmacy to be a part of this patient's care.  Georgina Pillion, PharmD, BCPS Clinical Pharmacist Clinical phone for 06/18/2020: [phone] 06/18/2020 11:25 AM   **Pharmacist phone directory can now be found on [email or web address] ([email or web address]).  Listed under [facility].

## 2020-06-19 LAB — CULTURE, BLOOD (ROUTINE X 2)
Culture: NO GROWTH
Culture: NO GROWTH
Special Requests: ADEQUATE
Special Requests: ADEQUATE

## 2020-06-25 ENCOUNTER — Telehealth: Payer: Self-pay | Admitting: Pharmacist

## 2020-06-25 NOTE — Telephone Encounter (Signed)
Are they holding vanc ?

## 2020-06-25 NOTE — Telephone Encounter (Signed)
Patient is currently receiving IV vancomycin and ceftriaxone for osteomyelitis of his finger. Recent labs from [facility] on 12/27 show a SCr of 1.60 (previous 0.88 on 12/21) and a vanc trough of 20.3.   Will continue to follow SCr and vanc troughs. Sending to MD for review.

## 2020-06-26 NOTE — Telephone Encounter (Signed)
I called yesterday when I got the labs and the pharmacist was at lunch. I called again this morning and he held vanc on Monday and Tuesday and the nurse is drawing labs today to see what his SCr is. He is staying hydrated and feeling well otherwise. I told her to call me when she gets the labs to see what his SCr is. If still elevated, he will go down to once daily vancomycin dosing but we could always see if he can afford daptomycin as well.

## 2020-07-04 ENCOUNTER — Inpatient Hospital Stay: Payer: Self-pay | Admitting: Infectious Diseases

## 2020-07-11 ENCOUNTER — Inpatient Hospital Stay: Payer: Self-pay | Admitting: Infectious Diseases

## 2020-07-11 ENCOUNTER — Ambulatory Visit: Payer: Self-pay | Admitting: Infectious Diseases

## 2020-07-15 ENCOUNTER — Telehealth: Payer: Self-pay | Admitting: *Deleted

## 2020-07-15 NOTE — Telephone Encounter (Signed)
I called back at the phone number you provided. Unable to reach. Will call back again.

## 2020-07-15 NOTE — Telephone Encounter (Signed)
Called patient to reschedule appt. Patient is to come back in the office to see the provider on Jan 21st. Patient states that he is having pain and swelling and wanted to know what to do in the meantime. Patient is requesting a call back. Patient number is [phone]. Thank you

## 2020-07-16 ENCOUNTER — Inpatient Hospital Stay: Payer: Self-pay | Admitting: Infectious Diseases

## 2020-07-19 ENCOUNTER — Ambulatory Visit (INDEPENDENT_AMBULATORY_CARE_PROVIDER_SITE_OTHER): Payer: Self-pay | Admitting: Infectious Diseases

## 2020-07-19 ENCOUNTER — Encounter: Payer: Self-pay | Admitting: Infectious Diseases

## 2020-07-19 ENCOUNTER — Telehealth: Payer: Self-pay

## 2020-07-19 ENCOUNTER — Other Ambulatory Visit: Payer: Self-pay

## 2020-07-19 VITALS — BP 139/83 | HR 90 | Temp 98.0°F | Wt 226.0 lb

## 2020-07-19 DIAGNOSIS — M869 Osteomyelitis, unspecified: Secondary | ICD-10-CM

## 2020-07-19 MED ORDER — OXYCODONE-ACETAMINOPHEN 5-325 MG PO TABS
1.0000 | ORAL_TABLET | Freq: Four times a day (QID) | ORAL | 0 refills | Status: AC | PRN
Start: 2020-07-19 — End: 2020-07-24

## 2020-07-19 NOTE — Progress Notes (Signed)
[facility]                                                             [place], [place], [place], [address]                                                                  Phn. 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 End date of abx 07/27/19 ESR and CRP today Will coordinate with [facility] for removal of PICC line Wound care referral  Fu in 1 week  All questions and concerns were discussed and addressed. Patient verbalized understanding of the plan. ____________________________________________________________________________________________________________________ Subjective/Interval events 07/19/20 He says he is getting IV vancomycin and ceftriaxone through PICC line in the RT arm. Has nausea which has started since the start of abx. Denies any vomiting. Also has 3-4 episodes of loose stool that has started since being on IV ax. Denies any fevers, chills and sweats. Denies any  abdominal cramps.   Says his rt middle finger hurts and would like to get something for pain which is little stronger. He would like to be seen by wound care as well. Denies tenderness/swelling and draiinage form the rt middle finger.  Lab from [facility] reviewed. CBC and CMP unremarkable except mildly elevated Ca++  Overall doing well with no complaints  ROS: 11 point ROS done with all pertinent positives and negatives listed above  Past Medical History:  Diagnosis Date  . Back pain    hx laminectomy  . Hypertension    Past Surgical History:  Procedure Laterality Date  . AMPUTATION Right 04/01/2020   Procedure: Right  index finger partial amputation;  Surgeon: Verner Mould, MD;  Location: [facility];  Service: Orthopedics;  Laterality: Right;  with block  . CARPAL TUNNEL RELEASE Right   . KNEE SURGERY Right   . LAMINECTOMY     Current Outpatient Medications on File Prior to Visit  Medication Sig Dispense Refill  . amphetamine-dextroamphetamine (ADDERALL) 10 MG tablet Take 10 mg by mouth daily with breakfast.    . atenolol (TENORMIN) 25 MG tablet Take 25 mg by mouth daily.     . cefTRIAXone (ROCEPHIN) IVPB Inject 2 g into the vein daily. Indication:  Osteomyelitis of right middle finger  First Dose: Yes Last Day of Therapy:  07/26/2020 Labs - Once weekly:  CBC/D and BMP, Labs - Every other week:  ESR and CRP Method of administration: IV Push Method of administration may be changed at the discretion of home infusion pharmacist based upon assessment of the patient and/or caregiver's ability to self-administer the medication ordered. 39 Units 0  . diazepam (VALIUM) 10 MG tablet Take 10 mg by mouth daily as needed (Muscle Spasms).    Marland Kitchen lisinopril (ZESTRIL) 10 MG tablet Take 10 mg by mouth daily.    . sildenafil (REVATIO) 20 MG tablet Take 20 mg by mouth daily as needed (for ED).    Marland Kitchen triamterene-hydrochlorothiazide (MAXZIDE-25) 37.5-25 MG tablet Take 1 tablet by mouth daily.    .  vancomycin IVPB Inject 1,750 mg into the vein every 12 (twelve) hours. Indication:  Osteomyelitis of right middle finger  First Dose: Yes Last Day of Therapy:  07/26/2020 Labs - Sunday/Monday:  CBC/D, BMP, and vancomycin trough. Labs - Thursday:  BMP and vancomycin trough Labs - Every other week:  ESR and CRP Method of administration:Elastomeric Method of administration may be changed at the discretion of the patient and/or caregiver's ability to self-administer the medication ordered. 78 Units 0   No current facility-administered medications on file prior to visit.   Social History   Socioeconomic History  . Marital status: Single    Spouse name: Not on file  . Number of children: 2  . Years of education: Not on file  . Highest education level: Not on file  Occupational History  . Occupation: Architect  Tobacco Use  . Smoking status: Never Smoker  . Smokeless tobacco: Current User    Types: Snuff  Vaping Use  . Vaping Use: Never used  Substance and Sexual Activity  . Alcohol use: Yes    Comment: socially  . Drug use: Never  . Sexual activity: Yes    Partners: Female  Other Topics Concern  . Not on file  Social History Narrative  . Not on file   Social Determinants of Health   Financial Resource Strain: Not on file  Food Insecurity: Not on file  Transportation Needs: Not on file  Physical Activity: Not on file  Stress: Not on file  Social Connections: Not on file  Intimate Partner Violence: Not on file    Vitals BP 139/83   Pulse 90   Temp 98 F (36.7 C) (Oral)   Wt 226 lb (102.5 kg)   BMI 28.25 kg/m  '  Examination  General - not in acute distress, comfortably sitting in chair HEENT - PEERLA, no pallor and no icterus Chest - b/l clear air entry, no additional sounds CVS- Normal s1s2, RRR Abdomen - Soft, Non tender , non distended Ext- no pedal edema Rt hand :    Neuro: grossly normal Back - WNL Psych : calm and cooperative   Recent  labs  Pertinent Microbiology Results for orders placed or performed during the hospital encounter of 06/14/20  Resp Panel by RT-PCR (Flu A&B, Covid) Nasopharyngeal Swab     Status: None   Collection Time: 06/14/20  1:22 PM   Specimen: Nasopharyngeal Swab; Nasopharyngeal(NP) swabs in vial transport medium  Result Value Ref Range Status   SARS Coronavirus 2 by RT PCR NEGATIVE NEGATIVE Final    Comment: (NOTE) SARS-CoV-2 target nucleic acids are NOT DETECTED.  The SARS-CoV-2 RNA is generally detectable in upper respiratory specimens during the acute phase of infection. The lowest concentration of SARS-CoV-2 viral copies this assay can detect is 138 copies/mL. A negative result does not preclude SARS-Cov-2 infection and should not be used as the sole basis for treatment or other patient management decisions. A negative result may occur with  improper specimen collection/handling, submission of specimen other than nasopharyngeal swab, presence of viral mutation(s) within the areas targeted by this assay, and inadequate number of viral copies(<138 copies/mL). A negative result must be combined with clinical observations, patient history, and epidemiological information. The expected result is Negative.  Fact Sheet for Patients:  EntrepreneurPulse.com.au  Fact Sheet for Healthcare Providers:  IncredibleEmployment.be  This test is no t yet approved or cleared by the [place] FDA and  has been authorized for detection and/or diagnosis of SARS-CoV-2 by FDA under an Emergency Use Authorization (EUA). This EUA will remain  in effect (meaning this test can be used) for the duration of the COVID-19 declaration under Section 564(b)(1) of the Act, 21 U.S.C.section 360bbb-3(b)(1), unless the authorization is terminated  or revoked sooner.       Influenza A by PCR NEGATIVE NEGATIVE Final   Influenza B by PCR NEGATIVE NEGATIVE Final    Comment:  (NOTE) The Xpert Xpress SARS-CoV-2/FLU/RSV plus assay is intended as an aid in the diagnosis of influenza from Nasopharyngeal swab specimens and should not be used as a sole basis for treatment. Nasal washings and aspirates are unacceptable for Xpert Xpress SARS-CoV-2/FLU/RSV testing.  Fact Sheet for Patients: EntrepreneurPulse.com.au  Fact Sheet for Healthcare Providers: IncredibleEmployment.be  This test is not yet approved or cleared by the [place] FDA and has been authorized for detection and/or diagnosis of SARS-CoV-2 by FDA under an Emergency Use Authorization (EUA). This EUA will remain in effect (meaning this test can be used) for the duration of the COVID-19 declaration under Section 564(b)(1) of the Act, 21 U.S.C. section 360bbb-3(b)(1), unless the authorization is terminated or revoked.  Performed at [facility], [place] [address] [place], [place] [address]   Blood culture (routine x 2)     Status: None   Collection Time: 06/14/20  1:26 PM   Specimen: BLOOD  Result Value Ref Range Status   Specimen Description BLOOD BLOOD LEFT FOREARM  Final   Special Requests   Final    BOTTLES DRAWN AEROBIC ONLY Blood Culture adequate volume   Culture   Final    NO GROWTH 5 DAYS Performed at [facility], [place] [address], [place], [place] [address]    Report Status 06/19/2020 FINAL  Final  Blood culture (routine x 2)     Status: None   Collection Time: 06/14/20  1:41 PM   Specimen: BLOOD  Result Value Ref Range Status   Specimen Description BLOOD LEFT ANTECUBITAL  Final   Special Requests   Final    BOTTLES DRAWN AEROBIC AND ANAEROBIC Blood Culture adequate volume   Culture   Final  NO GROWTH 5 DAYS Performed at [facility], [place] [address], [place], [place] [address]    Report Status 06/19/2020 FINAL  Final     All pertinent labs/Imagings/notes reviewed. All pertinent plain films and CT images have been  personally visualized and interpreted; radiology reports have been reviewed. Decision making incorporated into the Impression / Recommendations.  I spent greater than 25 minutes with the patient including  review of prior medical records with greater than 50% of time in face to face counsel of the patient.    Electronically signed by:  Rosiland Oz, MD Infectious Disease Physician [facility] [address] [place], [place] [address] Phone: [phone]  Fax: [phone]

## 2020-07-19 NOTE — Telephone Encounter (Signed)
Per MD called advance with orders to pull picc on 1/28 after last dose. Spoke with Jorja Loa who was able to take verbal order. Order was repeated and confirmed before ending call.

## 2020-07-20 LAB — C-REACTIVE PROTEIN: CRP: 4.9 mg/L (ref <8.0)

## 2020-07-20 LAB — SEDIMENTATION RATE: Sed Rate: 6 mm/h (ref 0–20)

## 2020-07-24 ENCOUNTER — Telehealth: Payer: Self-pay

## 2020-07-24 NOTE — Telephone Encounter (Signed)
Called placed to RN and gave verbal order to [facility] to d/c picc line. Verbal read back and understood.  Valarie Cones

## 2020-07-24 NOTE — Telephone Encounter (Signed)
Received call from [facility] RN stating patient his having pain in his arm at picc line site, picc is leaking and patient missed PM and AM dosing of vanc and ceftriaxone. RN suspects infiltration. Patient being treated for osteomyelitis of right index finger.  Current sed rate/crp within normal limits. RN asking if ok to go ahead and pull picc line, Last day of therapy is 07/26/20. Routing to provider for advise.  Valarie Cones

## 2020-07-26 ENCOUNTER — Encounter: Payer: Self-pay | Admitting: Infectious Diseases

## 2020-07-26 ENCOUNTER — Other Ambulatory Visit: Payer: Self-pay

## 2020-07-26 ENCOUNTER — Ambulatory Visit (INDEPENDENT_AMBULATORY_CARE_PROVIDER_SITE_OTHER): Payer: Self-pay | Admitting: Infectious Diseases

## 2020-07-26 VITALS — BP 110/70 | HR 95 | Temp 97.4°F | Wt 226.0 lb

## 2020-07-26 DIAGNOSIS — Z7185 Encounter for immunization safety counseling: Secondary | ICD-10-CM

## 2020-07-26 DIAGNOSIS — M869 Osteomyelitis, unspecified: Secondary | ICD-10-CM

## 2020-07-26 DIAGNOSIS — Z23 Encounter for immunization: Secondary | ICD-10-CM

## 2020-07-26 DIAGNOSIS — Z5181 Encounter for therapeutic drug level monitoring: Secondary | ICD-10-CM

## 2020-07-26 NOTE — Assessment & Plan Note (Signed)
S/p completion of treatment  Wound ius significantly improved Follow up with wound care

## 2020-07-26 NOTE — Assessment & Plan Note (Signed)
1st dose of Pfizer vaccine today

## 2020-07-26 NOTE — Assessment & Plan Note (Signed)
CMP today

## 2020-07-26 NOTE — Progress Notes (Signed)
[facility]                                                             [address], [place], [place], [address]                                                                  Phn. 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 However, there was issues with his PICC line leaking and it was removed on 1/26.  -His left finger wound has significanty improved in terms of healing and there is no concerns of infection  Currently -No further indication of PO antibiotics -Instructed patient to keep an eye on the PICC line site for any worsening pain and swelling and notify me. - CMP today given abnormalities in liver enzymes last visit  - Fu as needed  - He will follow up with wound care    All questions and concerns were discussed and addressed. Patient verbalized understanding of the plan. ____________________________________________________________________________________________________________________ Subjective/Interval Events 07/26/20 Jacob Sanchez is a 9 Y O male who is here for FU of Rt middle finger Osteomyelitis. PICC line was leaking and it was dc'ed on 1/26. Has some soreness in the PICC line removal site. Otherwise, no significant pain/tenderness and swelling. Frequency of loose stools is also improving.  Denies fevers,  chills, nausea and vomiting. He is willing to get COVID vaccine today   ROS: 12 point ROS done with pertinent positives and negatives listed above  Past Medical History:  Diagnosis Date  . Back pain    hx laminectomy  . Hypertension    Past Surgical History:  Procedure Laterality Date  . AMPUTATION Right 04/01/2020   Procedure: Right  index finger partial amputation;  Surgeon: Ernest Mallick, MD;  Location: [facility] OR;  Service: Orthopedics;  Laterality: Right;  with block  . CARPAL TUNNEL RELEASE Right   . KNEE SURGERY Right   . LAMINECTOMY     Current Outpatient Medications on File Prior to Visit  Medication Sig Dispense Refill  . amphetamine-dextroamphetamine (ADDERALL) 10 MG tablet Take  10 mg by mouth daily with breakfast.    . atenolol (TENORMIN) 25 MG tablet Take 25 mg by mouth daily.     . diazepam (VALIUM) 10 MG tablet Take 10 mg by mouth daily as needed (Muscle Spasms).    Marland Kitchen lisinopril (ZESTRIL) 10 MG tablet Take 10 mg by mouth daily.    . sildenafil (REVATIO) 20 MG tablet Take 20 mg by mouth daily as needed (for ED).    Marland Kitchen triamterene-hydrochlorothiazide (MAXZIDE-25) 37.5-25 MG tablet Take 1 tablet by mouth daily.     No current facility-administered medications on file prior to visit.   Allergies  Allergen Reactions  . Bee Venom Anaphylaxis  . Demerol [Meperidine] Anaphylaxis  . Codeine Rash   Social History   Socioeconomic History  . Marital status: Single    Spouse name: Not on file  . Number of children: 2  . Years of education: Not on file  . Highest education level: Not on file  Occupational History  . Occupation: Holiday representative  Tobacco Use  . Smoking status: Never Smoker  . Smokeless tobacco: Current User    Types: Snuff  Vaping Use  . Vaping Use: Never used  Substance and Sexual Activity  . Alcohol use: Yes    Comment: socially  . Drug use: Never  . Sexual activity: Yes    Partners: Female  Other Topics Concern  . Not on file  Social  History Narrative  . Not on file   Social Determinants of Health   Financial Resource Strain: Not on file  Food Insecurity: Not on file  Transportation Needs: Not on file  Physical Activity: Not on file  Stress: Not on file  Social Connections: Not on file  Intimate Partner Violence: Not on file    Vitals BP 110/70   Pulse 95   Temp (!) 97.4 F (36.3 C) (Oral)   Wt 226 lb (102.5 kg)   SpO2 99%   BMI 28.25 kg/m    Examination  General - not in acute distress, comfortably sitting in chair HEENT - PEERLA, no pallor and no icterus Chest - b/l clear air entry, no additional sounds CVS- Normal s1s2, RRR Abdomen - Soft, Non tender , non distended Ext- no pedal edema Rt hand     Neuro: grossly normal Back - WNL Psych : calm and cooperative   Recent labs CBC Latest Ref Rng & Units 06/16/2020 06/15/2020 06/14/2020  WBC 4.0 - 10.5 K/uL 6.9 7.4 7.2  Hemoglobin 13.0 - 17.0 g/dL 54.6 27.0 35.0  Hematocrit 39.0 - 52.0 % 46.2 43.2 48.2  Platelets 150 - 400 K/uL 210 206 234   CMP Latest Ref Rng & Units 06/18/2020 06/16/2020 06/15/2020  Glucose 70 - 99 mg/dL 093(G) 182(X) 937(J)  BUN 6 - 20 mg/dL 13 11 10   Creatinine 0.61 - 1.24 mg/dL 6.96 7.89  Sodium 135 - 145 mmol/L 135 135 135  Potassium 3.5 - 5.1 mmol/L 3.8 4.0 3.6  Chloride 98 - 111 mmol/L 105 101 104  CO2 22 - 32 mmol/L 24 24 24   Calcium 8.9 - 10.3 mg/dL 3.81 10.5(H) 10.1  Total Protein 6.5 - 8.1 g/dL 6.2(L) 6.6 6.2(L)  Total Bilirubin 0.3 - 1.2 mg/dL 0.5 0.5 0.7  Alkaline Phos 38 - 126 U/L 48 52 51  AST 15 - 41 U/L 71(H) 52(H) 42(H)  ALT 0 - 44 U/L 125(H) 99(H) 81(H)     Pertinent Microbiology Results for orders placed or performed during the hospital encounter of 06/14/20  Resp Panel by RT-PCR (Flu A&B, Covid) Nasopharyngeal Swab     Status: None   Collection Time: 06/14/20  1:22 PM   Specimen: Nasopharyngeal Swab; Nasopharyngeal(NP) swabs in vial transport medium  Result Value Ref Range Status    SARS Coronavirus 2 by RT PCR NEGATIVE NEGATIVE Final    Comment: (NOTE) SARS-CoV-2 target nucleic acids are NOT DETECTED.  The SARS-CoV-2 RNA is generally detectable in upper respiratory specimens during the acute phase of infection. The lowest concentration of SARS-CoV-2 viral copies this assay can detect is 138 copies/mL. A negative result does not preclude SARS-Cov-2 infection and should not be used as the sole basis for treatment or other patient management decisions. A negative result may occur with  improper specimen collection/handling, submission of specimen other than nasopharyngeal swab, presence of viral mutation(s) within the areas targeted by this assay, and inadequate number of viral copies(<138 copies/mL). A negative result must be combined with clinical observations, patient history, and epidemiological information. The expected result is Negative.  Fact Sheet for Patients:  BloggerCourse.com  Fact Sheet for Healthcare Providers:  SeriousBroker.it  This test is no t yet approved or cleared by the [place] FDA and  has been authorized for detection and/or diagnosis of SARS-CoV-2 by FDA under an Emergency Use Authorization (EUA). This EUA will remain  in effect (meaning this test can be used) for the duration of the COVID-19 declaration under Section 564(b)(1) of the Act, 21 U.S.C.section 360bbb-3(b)(1), unless the authorization is terminated  or revoked sooner.       Influenza A by PCR NEGATIVE NEGATIVE Final   Influenza B by PCR NEGATIVE NEGATIVE Final    Comment: (NOTE) The Xpert Xpress SARS-CoV-2/FLU/RSV plus assay is intended as an aid in the diagnosis of influenza from Nasopharyngeal swab specimens and should not be used as a sole basis for treatment. Nasal washings and aspirates are unacceptable for Xpert Xpress SARS-CoV-2/FLU/RSV testing.  Fact Sheet for  Patients: BloggerCourse.com  Fact Sheet for Healthcare Providers: SeriousBroker.it  This test is not yet approved or cleared by the [place] FDA and has been authorized for detection and/or diagnosis of SARS-CoV-2 by FDA under an Emergency Use Authorization (EUA). This EUA will remain in effect (meaning this test can be used) for the duration of the COVID-19 declaration under Section 564(b)(1) of the Act, 21 U.S.C. section 360bbb-3(b)(1), unless the authorization is terminated or revoked.  Performed at [facility], [address] [place], [place] [address]   Blood culture (routine x 2)     Status: None   Collection Time: 06/14/20  1:26 PM   Specimen: BLOOD  Result Value Ref Range Status   Specimen Description BLOOD BLOOD LEFT FOREARM  Final   Special Requests   Final    BOTTLES DRAWN AEROBIC ONLY Blood Culture adequate volume   Culture   Final    NO GROWTH 5 DAYS Performed at [facility], [address] [place], [place] [address]    Report Status 06/19/2020 FINAL  Final  Blood culture (routine x 2)     Status: None   Collection Time: 06/14/20  1:41 PM   Specimen: BLOOD  Result Value Ref Range Status   Specimen Description BLOOD LEFT ANTECUBITAL  Final   Special Requests   Final    BOTTLES DRAWN AEROBIC AND ANAEROBIC Blood Culture adequate volume   Culture   Final    NO GROWTH 5 DAYS Performed at [facility], [address]  [place], [place] [address]    Report Status 06/19/2020 FINAL  Final     All pertinent labs/Imagings/notes reviewed. All pertinent plain films and CT images have been personally visualized and interpreted; radiology reports have been reviewed. Decision making incorporated into the Impression / Recommendations.  I spent greater than 25 minutes with the patient including  review of prior medical records with greater than 50% of time in face to face counsel of the patient.     Electronically signed by:  Odette Fraction, MD Infectious Disease Physician [facility] [address] [place], [place] [address] Phone: [phone]  Fax: [phone]

## 2020-07-26 NOTE — Progress Notes (Signed)
   Covid-19 Vaccination Clinic  Name:  Jacob Sanchez    MRN: [id number] DOB: 1965/08/15  07/26/2020  [name] was observed post Covid-19 immunization for 30 minutes based on pre-vaccination screening without incident. He was provided with Vaccine Information Sheet and instruction to access the V-Safe system.   [name] was instructed to call 911 with any severe reactions post vaccine: Marland Kitchen Difficulty breathing  . Swelling of face and throat  . A fast heartbeat  . A bad rash all over body  . Dizziness and weakness   Lorenso Courier, CMA

## 2020-07-27 LAB — COMPREHENSIVE METABOLIC PANEL
AG Ratio: 1.8 (calc) (ref 1.0–2.5)
ALT: 101 U/L — ABNORMAL HIGH (ref 9–46)
AST: 58 U/L — ABNORMAL HIGH (ref 10–35)
Albumin: 4.1 g/dL (ref 3.6–5.1)
Alkaline phosphatase (APISO): 62 U/L (ref 35–144)
BUN: 17 mg/dL (ref 7–25)
CO2: 26 mmol/L (ref 20–32)
Calcium: 11.1 mg/dL — ABNORMAL HIGH (ref 8.6–10.3)
Chloride: 103 mmol/L (ref 98–110)
Creat: 1.18 mg/dL (ref 0.70–1.33)
Globulin: 2.3 g/dL (calc) (ref 1.9–3.7)
Glucose, Bld: 135 mg/dL — ABNORMAL HIGH (ref 65–99)
Potassium: 4 mmol/L (ref 3.5–5.3)
Sodium: 136 mmol/L (ref 135–146)
Total Bilirubin: 0.4 mg/dL (ref 0.2–1.2)
Total Protein: 6.4 g/dL (ref 6.1–8.1)

## 2020-07-30 ENCOUNTER — Ambulatory Visit: Payer: Self-pay | Admitting: Infectious Diseases

## 2020-08-22 ENCOUNTER — Encounter (HOSPITAL_BASED_OUTPATIENT_CLINIC_OR_DEPARTMENT_OTHER): Payer: Self-pay | Admitting: Internal Medicine

## 2020-11-11 IMAGING — DX DG HAND COMPLETE 3+V*R*
3 series · 3 of 3 positions shown · non-contrast
Comparison: None.

CLINICAL DATA: Pain to the right second and third fingers

EXAM:
RIGHT HAND - COMPLETE 3+ VIEW

[hand pa]
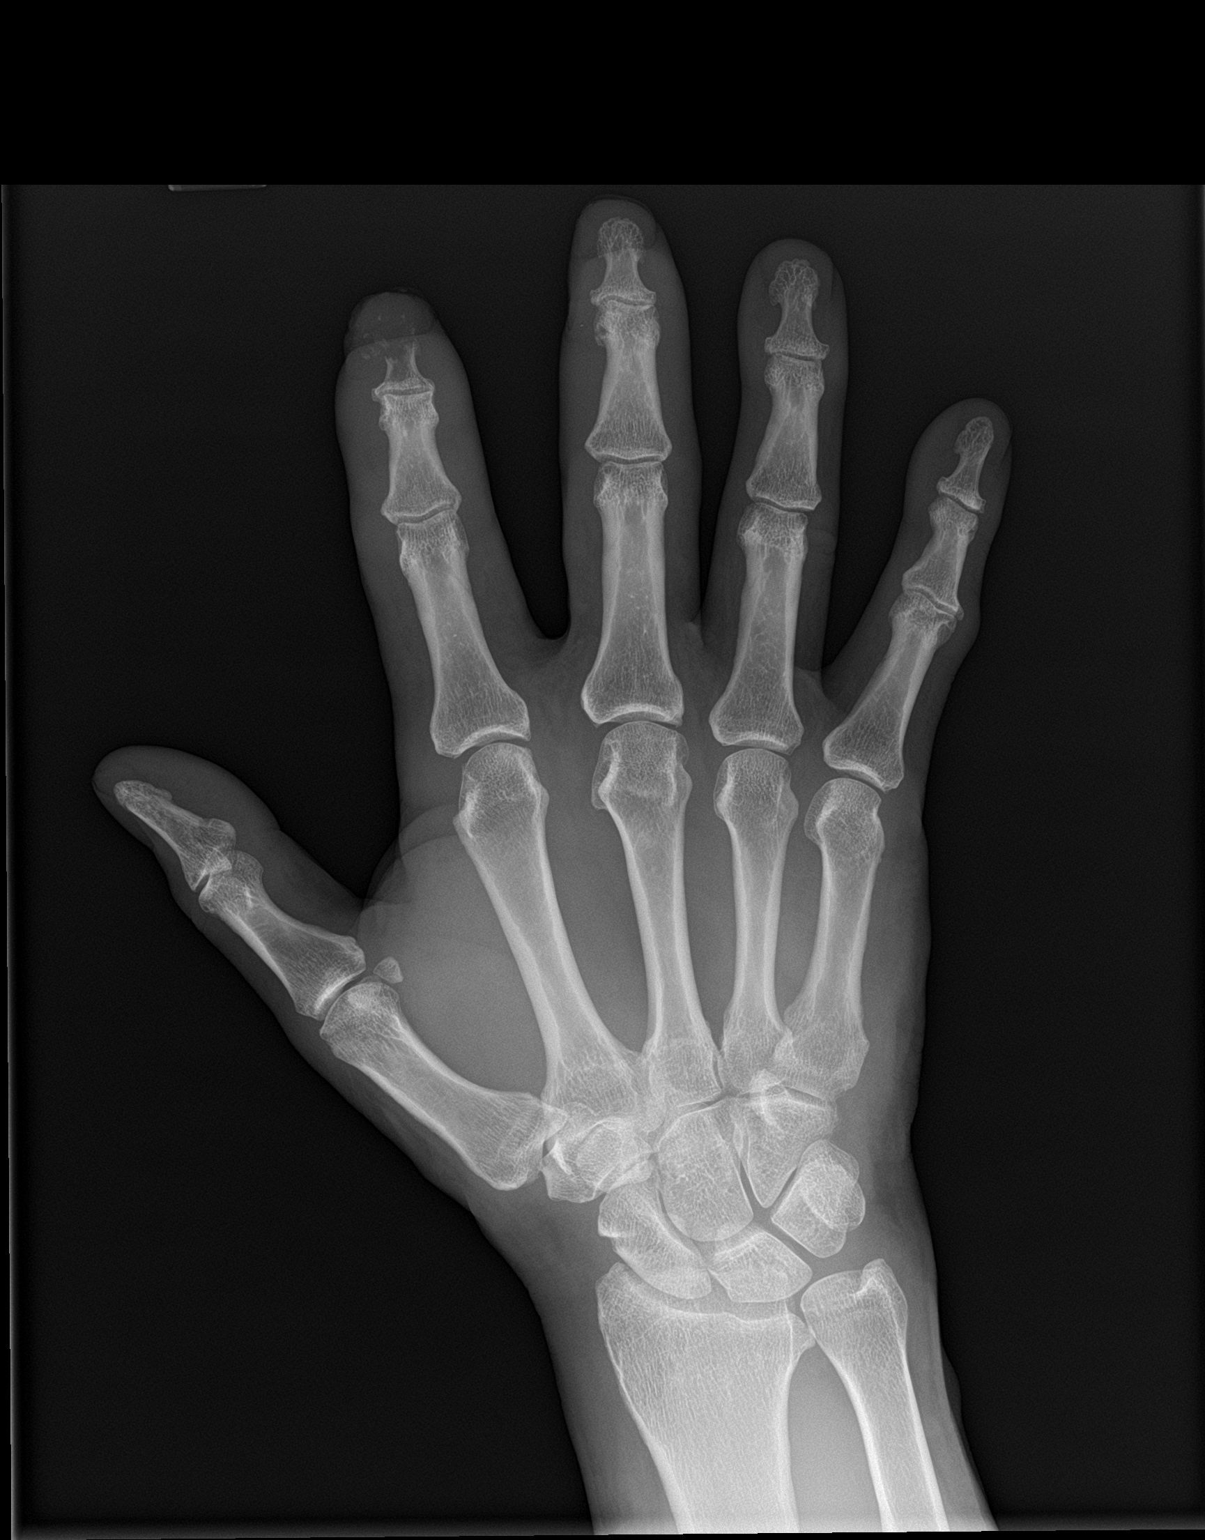

[hand obl]
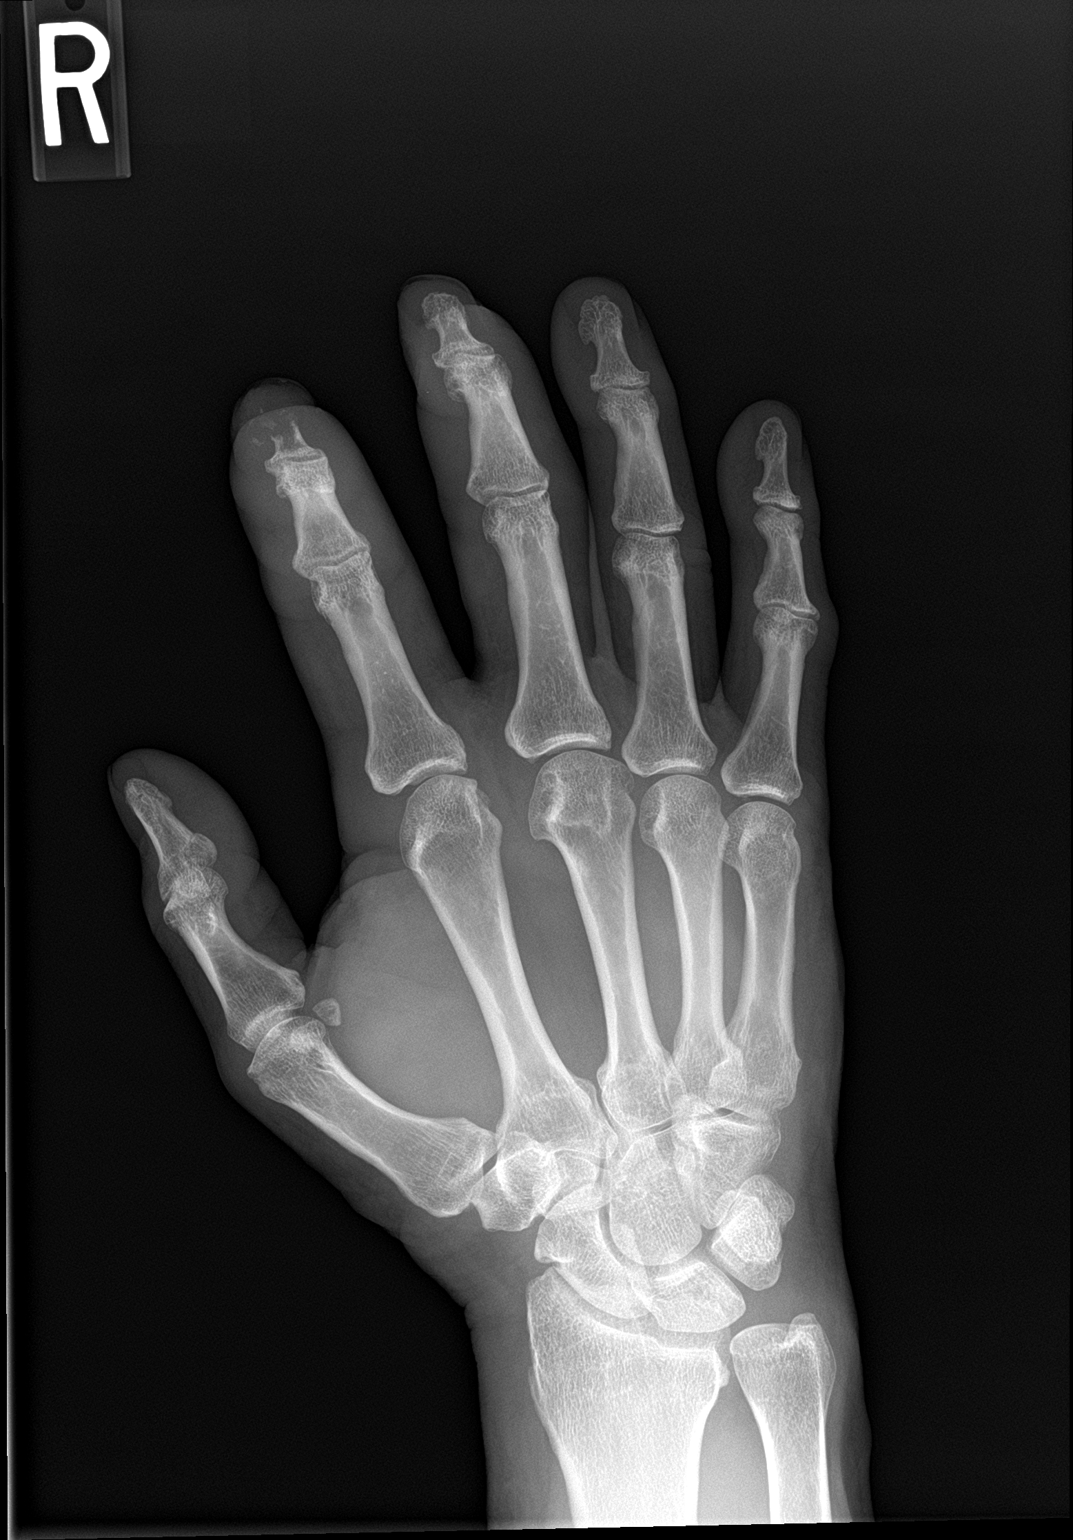

[hand lat]
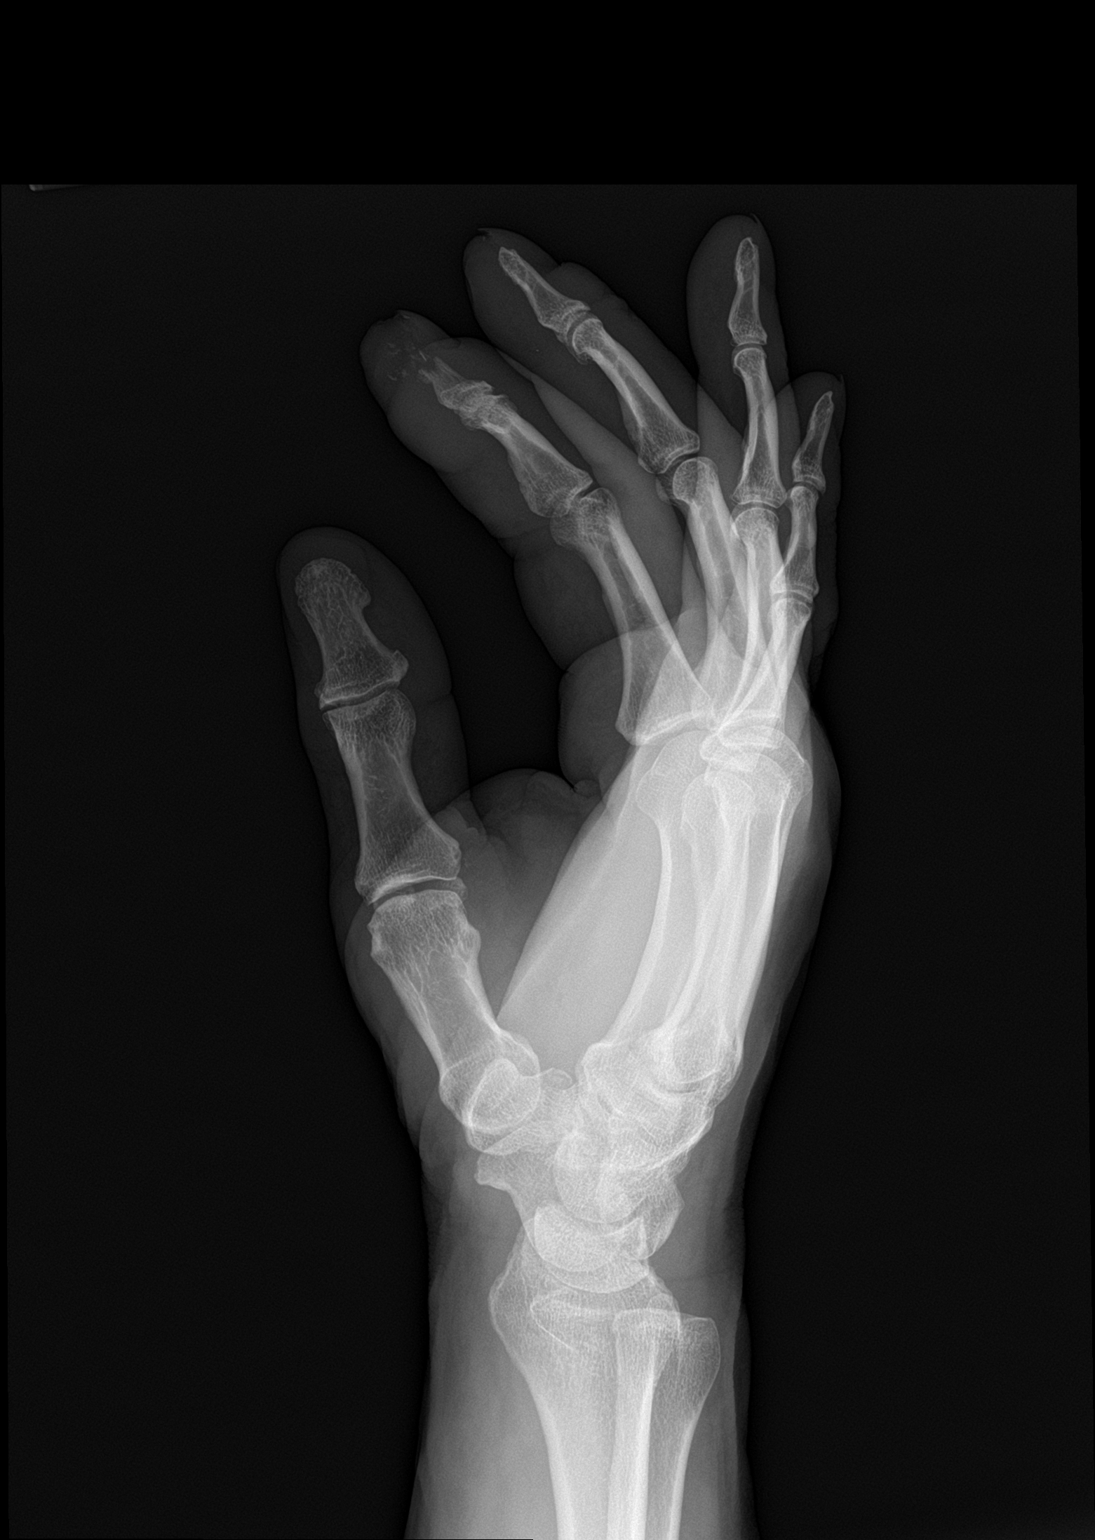

[3 of 3 positions shown; findings below may reference images not displayed]

FINDINGS: Osteolytic changes involving the tuft and distal shaft of the second
distal phalanx with bony fragmentation. No fracture or malalignment.
Positive for soft tissue swelling.
IMPRESSION: Osteolytic changes with fragmentation of the tuft and distal shaft
of the second distal phalanx concerning for osteomyelitis.
